# Patient Record
Sex: Female | Born: 1965
Health system: Southern US, Community
[De-identification: ages and names within clinical notes are randomized; demographics above are authoritative.]

## PROBLEM LIST (undated history)

## (undated) DIAGNOSIS — I10 Essential (primary) hypertension: Secondary | ICD-10-CM

## (undated) DIAGNOSIS — E119 Type 2 diabetes mellitus without complications: Secondary | ICD-10-CM

## (undated) HISTORY — DX: Type 2 diabetes mellitus without complications: E11.9

## (undated) HISTORY — PX: TUBAL LIGATION: SHX77

## (undated) HISTORY — DX: Essential (primary) hypertension: I10

---

## 2001-07-15 ENCOUNTER — Other Ambulatory Visit: Admission: RE | Admit: 2001-07-15 | Discharge: 2001-07-15 | Payer: Self-pay | Admitting: *Deleted

## 2001-08-21 ENCOUNTER — Encounter: Payer: Self-pay | Admitting: *Deleted

## 2001-08-21 ENCOUNTER — Ambulatory Visit (HOSPITAL_COMMUNITY): Admission: RE | Admit: 2001-08-21 | Discharge: 2001-08-21 | Payer: Self-pay | Admitting: *Deleted

## 2001-09-25 ENCOUNTER — Ambulatory Visit (HOSPITAL_COMMUNITY): Admission: RE | Admit: 2001-09-25 | Discharge: 2001-09-25 | Payer: Self-pay | Admitting: *Deleted

## 2001-09-25 ENCOUNTER — Encounter: Payer: Self-pay | Admitting: *Deleted

## 2001-11-17 ENCOUNTER — Ambulatory Visit (HOSPITAL_COMMUNITY): Admission: RE | Admit: 2001-11-17 | Discharge: 2001-11-17 | Payer: Self-pay | Admitting: *Deleted

## 2004-09-28 ENCOUNTER — Other Ambulatory Visit: Admission: RE | Admit: 2004-09-28 | Discharge: 2004-09-28 | Payer: Self-pay | Admitting: *Deleted

## 2005-11-19 LAB — CONVERTED CEMR LAB: Pap Smear: NORMAL

## 2006-05-27 ENCOUNTER — Ambulatory Visit: Payer: Self-pay | Admitting: Internal Medicine

## 2006-06-12 ENCOUNTER — Encounter (INDEPENDENT_AMBULATORY_CARE_PROVIDER_SITE_OTHER): Payer: Self-pay | Admitting: Internal Medicine

## 2006-07-26 ENCOUNTER — Ambulatory Visit: Payer: Self-pay | Admitting: Internal Medicine

## 2006-08-20 ENCOUNTER — Encounter (INDEPENDENT_AMBULATORY_CARE_PROVIDER_SITE_OTHER): Payer: Self-pay | Admitting: Internal Medicine

## 2006-08-23 ENCOUNTER — Ambulatory Visit: Payer: Self-pay | Admitting: Internal Medicine

## 2006-11-22 DIAGNOSIS — I10 Essential (primary) hypertension: Secondary | ICD-10-CM | POA: Insufficient documentation

## 2006-12-03 ENCOUNTER — Ambulatory Visit: Payer: Self-pay | Admitting: Family Medicine

## 2006-12-03 ENCOUNTER — Encounter (INDEPENDENT_AMBULATORY_CARE_PROVIDER_SITE_OTHER): Payer: Self-pay | Admitting: Internal Medicine

## 2006-12-04 ENCOUNTER — Encounter (INDEPENDENT_AMBULATORY_CARE_PROVIDER_SITE_OTHER): Payer: Self-pay | Admitting: Internal Medicine

## 2006-12-23 ENCOUNTER — Encounter (INDEPENDENT_AMBULATORY_CARE_PROVIDER_SITE_OTHER): Payer: Self-pay | Admitting: Internal Medicine

## 2006-12-26 ENCOUNTER — Telehealth (INDEPENDENT_AMBULATORY_CARE_PROVIDER_SITE_OTHER): Payer: Self-pay | Admitting: Internal Medicine

## 2007-03-13 ENCOUNTER — Encounter (INDEPENDENT_AMBULATORY_CARE_PROVIDER_SITE_OTHER): Payer: Self-pay | Admitting: Internal Medicine

## 2007-03-17 ENCOUNTER — Ambulatory Visit: Payer: Self-pay | Admitting: Internal Medicine

## 2007-03-17 DIAGNOSIS — B353 Tinea pedis: Secondary | ICD-10-CM | POA: Insufficient documentation

## 2007-03-17 DIAGNOSIS — E876 Hypokalemia: Secondary | ICD-10-CM | POA: Insufficient documentation

## 2007-03-17 LAB — CONVERTED CEMR LAB
Creatinine, Urine: 299.3 mg/dL
Hgb A1c MFr Bld: 7.8 %
Microalb Creat Ratio: 25.7 mg/g (ref 0.0–30.0)
Microalb, Ur: 7.69 mg/dL — ABNORMAL HIGH (ref 0.00–1.89)

## 2007-03-19 LAB — CONVERTED CEMR LAB: Potassium: 3 meq/L — ABNORMAL LOW (ref 3.5–5.3)

## 2007-03-20 ENCOUNTER — Encounter (INDEPENDENT_AMBULATORY_CARE_PROVIDER_SITE_OTHER): Payer: Self-pay | Admitting: Internal Medicine

## 2007-06-18 ENCOUNTER — Telehealth (INDEPENDENT_AMBULATORY_CARE_PROVIDER_SITE_OTHER): Payer: Self-pay | Admitting: *Deleted

## 2007-06-18 ENCOUNTER — Ambulatory Visit: Payer: Self-pay | Admitting: Internal Medicine

## 2007-06-18 LAB — CONVERTED CEMR LAB: Hgb A1c MFr Bld: 9.4 %

## 2007-06-19 LAB — CONVERTED CEMR LAB
BUN: 7 mg/dL (ref 6–23)
CO2: 24 meq/L (ref 19–32)
Calcium: 9.1 mg/dL (ref 8.4–10.5)
Chloride: 102 meq/L (ref 96–112)
Cholesterol: 149 mg/dL (ref 0–200)
Creatinine, Ser: 0.76 mg/dL (ref 0.40–1.20)
Glucose, Bld: 268 mg/dL — ABNORMAL HIGH (ref 70–99)
HDL: 43 mg/dL (ref >39)
Hgb A1c MFr Bld: 10.3 % — ABNORMAL HIGH (ref 4.6–6.1)
LDL Cholesterol: 93 mg/dL (ref 0–99)
Potassium: 3.5 meq/L (ref 3.5–5.3)
Sodium: 136 meq/L (ref 135–145)
Total CHOL/HDL Ratio: 3.5
Triglycerides: 63 mg/dL (ref <150)
VLDL: 13 mg/dL (ref 0–40)

## 2007-06-20 ENCOUNTER — Telehealth (INDEPENDENT_AMBULATORY_CARE_PROVIDER_SITE_OTHER): Payer: Self-pay | Admitting: *Deleted

## 2007-09-09 ENCOUNTER — Ambulatory Visit: Payer: Self-pay | Admitting: Internal Medicine

## 2007-12-23 ENCOUNTER — Telehealth (INDEPENDENT_AMBULATORY_CARE_PROVIDER_SITE_OTHER): Payer: Self-pay | Admitting: *Deleted

## 2007-12-23 ENCOUNTER — Ambulatory Visit: Payer: Self-pay | Admitting: Internal Medicine

## 2007-12-23 LAB — CONVERTED CEMR LAB: Hgb A1c MFr Bld: 7.3 %

## 2007-12-25 ENCOUNTER — Encounter (INDEPENDENT_AMBULATORY_CARE_PROVIDER_SITE_OTHER): Payer: Self-pay | Admitting: Internal Medicine

## 2007-12-26 ENCOUNTER — Telehealth (INDEPENDENT_AMBULATORY_CARE_PROVIDER_SITE_OTHER): Payer: Self-pay | Admitting: *Deleted

## 2007-12-26 LAB — CONVERTED CEMR LAB
CO2: 25 meq/L (ref 19–32)
Glucose, Bld: 149 mg/dL — ABNORMAL HIGH (ref 70–99)
Potassium: 3.3 meq/L — ABNORMAL LOW (ref 3.5–5.3)
Sodium: 139 meq/L (ref 135–145)

## 2007-12-31 ENCOUNTER — Encounter (INDEPENDENT_AMBULATORY_CARE_PROVIDER_SITE_OTHER): Payer: Self-pay | Admitting: Internal Medicine

## 2008-03-24 ENCOUNTER — Ambulatory Visit: Payer: Self-pay | Admitting: Internal Medicine

## 2008-03-24 DIAGNOSIS — R350 Frequency of micturition: Secondary | ICD-10-CM

## 2008-03-24 LAB — CONVERTED CEMR LAB: Blood Glucose, Fingerstick: 168

## 2008-08-04 ENCOUNTER — Ambulatory Visit: Payer: Self-pay | Admitting: Internal Medicine

## 2008-08-04 LAB — CONVERTED CEMR LAB
Blood Glucose, Fingerstick: 219
Hgb A1c MFr Bld: 8.7 %

## 2008-08-09 ENCOUNTER — Encounter (INDEPENDENT_AMBULATORY_CARE_PROVIDER_SITE_OTHER): Payer: Self-pay | Admitting: Internal Medicine

## 2008-08-11 ENCOUNTER — Ambulatory Visit: Payer: Self-pay | Admitting: Internal Medicine

## 2008-08-11 LAB — CONVERTED CEMR LAB
ALT: 14 units/L (ref 0–35)
AST: 14 units/L (ref 0–37)
CO2: 23 meq/L (ref 19–32)
Chloride: 98 meq/L (ref 96–112)
Cholesterol: 154 mg/dL (ref 0–200)
LDL Cholesterol: 100 mg/dL — ABNORMAL HIGH (ref 0–99)
Sodium: 138 meq/L (ref 135–145)
Total Bilirubin: 0.7 mg/dL (ref 0.3–1.2)
Total CHOL/HDL Ratio: 3.7
Total Protein: 7.8 g/dL (ref 6.0–8.3)
VLDL: 12 mg/dL (ref 0–40)

## 2008-09-06 ENCOUNTER — Ambulatory Visit: Payer: Self-pay | Admitting: Internal Medicine

## 2008-11-03 ENCOUNTER — Encounter (INDEPENDENT_AMBULATORY_CARE_PROVIDER_SITE_OTHER): Payer: Self-pay | Admitting: Internal Medicine

## 2008-11-26 ENCOUNTER — Ambulatory Visit: Payer: Self-pay | Admitting: Internal Medicine

## 2008-11-26 DIAGNOSIS — E119 Type 2 diabetes mellitus without complications: Secondary | ICD-10-CM

## 2008-11-26 LAB — CONVERTED CEMR LAB
Blood Glucose, Fingerstick: 177
Hgb A1c MFr Bld: 6.8 %

## 2009-02-25 ENCOUNTER — Encounter (INDEPENDENT_AMBULATORY_CARE_PROVIDER_SITE_OTHER): Payer: Self-pay | Admitting: Internal Medicine

## 2009-03-02 ENCOUNTER — Ambulatory Visit: Payer: Self-pay | Admitting: Internal Medicine

## 2009-03-02 LAB — CONVERTED CEMR LAB: Blood Glucose, Fingerstick: 201

## 2009-03-03 ENCOUNTER — Encounter (INDEPENDENT_AMBULATORY_CARE_PROVIDER_SITE_OTHER): Payer: Self-pay | Admitting: Internal Medicine

## 2009-03-03 LAB — CONVERTED CEMR LAB: Microalb, Ur: 6.28 mg/dL — ABNORMAL HIGH (ref 0.00–1.89)

## 2009-03-07 ENCOUNTER — Encounter (INDEPENDENT_AMBULATORY_CARE_PROVIDER_SITE_OTHER): Payer: Self-pay | Admitting: Internal Medicine

## 2009-03-07 LAB — CONVERTED CEMR LAB
AST: 19 units/L (ref 0–37)
Alkaline Phosphatase: 70 units/L (ref 39–117)
BUN: 11 mg/dL (ref 6–23)
Creatinine, Ser: 0.75 mg/dL (ref 0.40–1.20)
Glucose, Bld: 112 mg/dL — ABNORMAL HIGH (ref 70–99)
HDL: 41 mg/dL (ref >39)
LDL Cholesterol: 85 mg/dL (ref 0–99)
Potassium: 3.1 meq/L — ABNORMAL LOW (ref 3.5–5.3)
Total Bilirubin: 0.5 mg/dL (ref 0.3–1.2)
Total CHOL/HDL Ratio: 3.4
Triglycerides: 73 mg/dL (ref <150)
VLDL: 15 mg/dL (ref 0–40)

## 2009-03-11 ENCOUNTER — Encounter (INDEPENDENT_AMBULATORY_CARE_PROVIDER_SITE_OTHER): Payer: Self-pay | Admitting: Internal Medicine

## 2009-04-11 ENCOUNTER — Telehealth (INDEPENDENT_AMBULATORY_CARE_PROVIDER_SITE_OTHER): Payer: Self-pay | Admitting: *Deleted

## 2009-07-06 ENCOUNTER — Ambulatory Visit: Payer: Self-pay | Admitting: Internal Medicine

## 2009-07-06 LAB — CONVERTED CEMR LAB: Hgb A1c MFr Bld: 6.8 %

## 2010-12-17 LAB — CONVERTED CEMR LAB
BUN: 9 mg/dL
CO2: 26 meq/L
Calcium: 8.9 mg/dL
Chloride: 102 meq/L
Cholesterol: 145 mg/dL
Creatinine, Ser: 0.77 mg/dL
Glucose, Bld: 125 mg/dL
Glucose, Bld: 186 mg/dL — ABNORMAL HIGH (ref 70–99)
HDL: 41 mg/dL
LDL Cholesterol: 92 mg/dL
Potassium: 3.1 meq/L — ABNORMAL LOW (ref 3.5–5.3)
Potassium: 3.2 meq/L
Sodium: 139 meq/L
Total CHOL/HDL Ratio: 3.5
Triglycerides: 58 mg/dL
VLDL: 12 mg/dL

## 2011-04-06 NOTE — H&P (Signed)
[facility]  Patient:    Jillian Adkins, Jillian Adkins Visit Number: [id number] MRN: [id number]          Service Type: OBS Location: 4A A427 01 Attending Physician:  Jeri Cos. Dictated by:   Langley Gauss, M.D. Admit Date:  11/17/2001   CC:         [facility]  Christin Bach, M.D.   History and Physical  OB OBSERVATION NOTE  HISTORY OF PRESENT ILLNESS:  This is a 45 year old gravida 3, para 2, with a history of chronic essential hypertension preceding the pregnancy.  The patient is likewise noted to have experienced problems with hypertension and superimposed preeclampsia with her first pregnancy.  With her second pregnancy she did well on antihypertensive medications.  Currently, her dosages are labetalol 400 mg p.o. b.i.d. and hydralazine 25 mg p.o. t.i.d.  Pertinently, the prescription for hydralazine was written on October 14, 2001, but the patient has never had it filled.  The patient comes in today for a routine prenatal visit; however, she states that she vomited today about 45 minutes after taking her dose of labetalol.  Her last dose of 400 mg of labetalol would have been about 6 oclock last night.  The patient at this time denies any headaches, blurred vision, right upper quadrant pain.  She denies any significant swelling.  She denies any uterine activity but states fetal activity has been good.  The patient states she has been somewhat stressed this morning and very hurried in trying to make her scheduled office visit. The patients prenatal course was also complicated by an abnormal one-hour GTT.  Glucose tolerance test was elevated at 157; however, a definitive three-hour GGT was noted to be normal.  The patient was also noted to have an abnormal AFP, placing her at 1/111 risk of Downs syndrome.  OBSTETRICAL HISTORY:  May 2000, repeat low transverse cesarean section at [redacted] weeks gestation, delivered 7-pound, 7.2-ounce  female infant.  Pertinently, discharge medications at that time following delivery:  Labetalol 200 mg p.o. b.i.d., hydrochlorothiazide 25 mg p.o. q.d., and Apresoline 25 mg p.o. t.i.d. OB prior to that:  Primary low transverse cesarean section December 1998, at 37-1/[redacted] weeks gestation.  Patient noted to experience severe pregnancy-induced hypertension at that time.  ALLERGIES:  No known drug allergies.  CURRENT MEDICATIONS:  The patient has been noncompliant, thus, has only taken labetalol 400 mg p.o. b.i.d.  PHYSICAL EXAMINATION:  VITAL SIGNS:  Height is 5 feet 4 inches, prenatal weight 262, todays weight is 276.  Pertinently, weight October 28, 2001, was 281.  Pulse rate 80, respiratory rate 20.  GENERAL:  Some facial swelling.  NECK:  Thyroid nonpalpable.  Supple.  LUNGS:  Clear.  CARDIOVASCULAR:  Regular rate and rhythm.  No gallops or murmurs are identified.  No evidence of cardiac failure.  ABDOMEN:  Soft and nontender.  Fundal height of 32 cm.  She is vertex presentation by Thayer Ohm maneuver.  Currently, there is no right upper quadrant pain appreciated.  EXTREMITIES:  Noted to reveal 1+ pretibial edema.  Deep tendon reflexes noted to be 2+ at the knees, with no clonus identified.  LABORATORY DATA:  In the office, a urine dipstick positive for 250 of protein, glucose noted to be negative.  The patient does have trace rbcs.  On the patients last visit she failed to produce a urine sample.  Last tested urine October 08, 2001, trace protein and negative glucose.  ASSESSMENT:  Patient with chronic  essential hypertension preceding the pregnancy.  More difficult to control this pregnancy than with previous, which may be partly related to the patients failure to optimize weight in between the pregnancies.  The patient has also been somewhat noncompliant in obtaining her prescriptions and taking them as recommended.  This, at this point in time, we will refer the patient to  [facility].  PLAN:  Will administer the labetalol 400 mg p.o. b.i.d. as well as the hydralazine 25 mg p.o. t.i.d., which the patient should be on anyway.  A non-stress test will be obtained.  Laboratory studies, likewise, will be obtained.  Will evaluate for the presence of any proteinuria.  Depending on the patients clinical status, she may require hospitalization overnight or may possibly be clinically stable for discharge today if medications are taken as prescribed. Dictated by:   Langley Gauss, M.D. Attending Physician:  Jeri Cos DD:  11/17/01 TD:  11/17/01 Job: [id number]

## 2011-07-25 ENCOUNTER — Other Ambulatory Visit (HOSPITAL_COMMUNITY): Payer: Self-pay | Admitting: Family Medicine

## 2011-07-25 DIAGNOSIS — I10 Essential (primary) hypertension: Secondary | ICD-10-CM

## 2011-07-25 DIAGNOSIS — Z01419 Encounter for gynecological examination (general) (routine) without abnormal findings: Secondary | ICD-10-CM

## 2011-07-25 DIAGNOSIS — E119 Type 2 diabetes mellitus without complications: Secondary | ICD-10-CM

## 2011-07-27 ENCOUNTER — Ambulatory Visit (HOSPITAL_COMMUNITY): Payer: Self-pay

## 2011-07-30 ENCOUNTER — Ambulatory Visit (HOSPITAL_COMMUNITY): Payer: Self-pay

## 2011-08-03 ENCOUNTER — Ambulatory Visit (HOSPITAL_COMMUNITY)
Admission: RE | Admit: 2011-08-03 | Discharge: 2011-08-03 | Disposition: A | Discharge status: Home / Self Care | Payer: 59 | Source: Ambulatory Visit | Attending: Family Medicine | Admitting: Family Medicine

## 2011-08-03 DIAGNOSIS — Z01419 Encounter for gynecological examination (general) (routine) without abnormal findings: Secondary | ICD-10-CM

## 2011-08-03 DIAGNOSIS — E119 Type 2 diabetes mellitus without complications: Secondary | ICD-10-CM

## 2011-08-03 DIAGNOSIS — Z1231 Encounter for screening mammogram for malignant neoplasm of breast: Secondary | ICD-10-CM | POA: Insufficient documentation

## 2011-08-03 DIAGNOSIS — I10 Essential (primary) hypertension: Secondary | ICD-10-CM

## 2012-04-30 ENCOUNTER — Other Ambulatory Visit (HOSPITAL_COMMUNITY)
Admission: RE | Admit: 2012-04-30 | Discharge: 2012-04-30 | Disposition: A | Discharge status: Home / Self Care | Payer: 59 | Source: Ambulatory Visit | Attending: Obstetrics and Gynecology | Admitting: Obstetrics and Gynecology

## 2012-04-30 ENCOUNTER — Other Ambulatory Visit: Payer: Self-pay | Admitting: Obstetrics and Gynecology

## 2012-04-30 DIAGNOSIS — Z01419 Encounter for gynecological examination (general) (routine) without abnormal findings: Secondary | ICD-10-CM | POA: Insufficient documentation

## 2012-09-11 ENCOUNTER — Other Ambulatory Visit: Payer: Self-pay | Admitting: Obstetrics and Gynecology

## 2012-09-11 DIAGNOSIS — Z139 Encounter for screening, unspecified: Secondary | ICD-10-CM

## 2012-09-22 ENCOUNTER — Ambulatory Visit (HOSPITAL_COMMUNITY)
Admission: RE | Admit: 2012-09-22 | Discharge: 2012-09-22 | Disposition: A | Discharge status: Home / Self Care | Payer: 59 | Source: Ambulatory Visit | Attending: Obstetrics and Gynecology | Admitting: Obstetrics and Gynecology

## 2012-09-22 DIAGNOSIS — Z139 Encounter for screening, unspecified: Secondary | ICD-10-CM

## 2012-09-22 DIAGNOSIS — Z1231 Encounter for screening mammogram for malignant neoplasm of breast: Secondary | ICD-10-CM | POA: Insufficient documentation

## 2013-09-24 ENCOUNTER — Other Ambulatory Visit (HOSPITAL_COMMUNITY): Payer: Self-pay | Admitting: Family Medicine

## 2013-09-24 DIAGNOSIS — Z139 Encounter for screening, unspecified: Secondary | ICD-10-CM

## 2013-10-05 ENCOUNTER — Ambulatory Visit (HOSPITAL_COMMUNITY)
Admission: RE | Admit: 2013-10-05 | Discharge: 2013-10-05 | Disposition: A | Discharge status: Home / Self Care | Payer: 59 | Source: Ambulatory Visit | Attending: Family Medicine | Admitting: Family Medicine

## 2013-10-05 DIAGNOSIS — Z139 Encounter for screening, unspecified: Secondary | ICD-10-CM

## 2013-10-05 DIAGNOSIS — Z1231 Encounter for screening mammogram for malignant neoplasm of breast: Secondary | ICD-10-CM | POA: Insufficient documentation

## 2014-09-27 ENCOUNTER — Other Ambulatory Visit (HOSPITAL_COMMUNITY): Payer: Self-pay | Admitting: Physician Assistant

## 2014-09-27 DIAGNOSIS — Z1231 Encounter for screening mammogram for malignant neoplasm of breast: Secondary | ICD-10-CM

## 2014-10-18 ENCOUNTER — Ambulatory Visit (HOSPITAL_COMMUNITY)
Admission: RE | Admit: 2014-10-18 | Discharge: 2014-10-18 | Disposition: A | Discharge status: Home / Self Care | Payer: 59 | Source: Ambulatory Visit | Attending: Physician Assistant | Admitting: Physician Assistant

## 2014-10-18 DIAGNOSIS — Z1231 Encounter for screening mammogram for malignant neoplasm of breast: Secondary | ICD-10-CM

## 2015-01-27 ENCOUNTER — Ambulatory Visit (INDEPENDENT_AMBULATORY_CARE_PROVIDER_SITE_OTHER): Payer: 59 | Admitting: Obstetrics and Gynecology

## 2015-01-27 ENCOUNTER — Encounter: Payer: Self-pay | Admitting: Obstetrics and Gynecology

## 2015-01-27 VITALS — BP 140/90 | Ht 64.0 in | Wt 253.0 lb

## 2015-01-27 DIAGNOSIS — N926 Irregular menstruation, unspecified: Secondary | ICD-10-CM

## 2015-01-27 NOTE — Progress Notes (Signed)
Patient ID: Roselle Locus, female   DOB: 06/08/1966, 49 y.o.   MRN: [id number] This chart was SCRIBED for Mallory Shirk, MD by Stephania Fragmin, ED Scribe. This patient was seen in room 1 and the patient's care was started at 4:07 PM.    [facility] Visit  Patient name: VEEDA VIRGO MRN [id number]  Date of birth: 1966-09-27  CC & HPI:  SERAH NICOLETTI is a 49 y.o. female presenting today for irregular menses. She states that during her period, she has to wear a heavy pad every day. She also reports clots and abdominal cramps. Patient states she was seen for the same by me in 2014. Patient's last pap was done in 04/30/2012, per chart review.  ROS:  +Menstrual problem No other complaints  Pertinent History Reviewed:   Reviewed: Significant for Cesarean section and tubal ligation Medical         Past Medical History  Diagnosis Date  . Hypertension   . Diabetes mellitus without complication                               Surgical Hx:    Past Surgical History  Procedure Laterality Date  . Cesarean section  [phone]  . Tubal ligation     Medications: Reviewed & Updated - see associated section                       Current outpatient prescriptions:  .  amLODipine (NORVASC) 10 MG tablet, Take 10 mg by mouth daily., Disp: , Rfl: 1 .  JENTADUETO 2.03-999 MG TABS, Take 1 tablet by mouth 2 (two) times daily., Disp: , Rfl: 2 .  lisinopril (PRINIVIL,ZESTRIL) 5 MG tablet, Take 5 mg by mouth daily., Disp: , Rfl: 1 .  spironolactone (ALDACTONE) 25 MG tablet, Take 25 mg by mouth daily., Disp: , Rfl: 3   Social History: Reviewed -  reports that she has never smoked. She has never used smokeless tobacco.  Objective Findings:  Vitals: Blood pressure 140/90, height 5\' 4"  (1.626 m), weight 253 lb (114.76 kg), last menstrual period 01/01/2015.  Physical Examination: General appearance - alert, well appearing, and in no distress, oriented to person, place, and time and overweight Mental  status - alert, oriented to person, place, and time, normal mood, behavior, speech, dress, motor activity, and thought processes, affect appropriate to mood Pelvic - normal external genitalia, vulva, vagina, cervix, uterus and adnexa,  VULVA: normal appearing vulva with no masses, tenderness or lesions,  VAGINA: normal appearing vagina with normal color and discharge, no lesions,  CERVIX: normal appearing cervix without discharge or lesions,  UTERUS:soft tender to touch . Size assessment limited by pt anxiety and habitus.  ADNEXA: normal adnexa in size, nontender and no masses,  RECTAL: normal rectal, no masses   Assessment & Plan:   A:  1. AUB. 2 obesity limits exam  P:  1. Will schedule [place] of Uterus and discuss 2. Last pap 2013, needs pap/physical  I personally performed the services described in this documentation, which was SCRIBED in my presence. The recorded information has been reviewed and considered accurate. It has been edited as necessary during review. Jonnie Kind, MD

## 2015-01-27 NOTE — Progress Notes (Signed)
Patient ID: Jillian Adkins, female   DOB: 11-19-1966, 49 y.o.   MRN: [id number] Pt here today for irregular and prolonged periods. Pt states that she has problems with her periods for the past few months. Pt states that sometimes she has a heaviness in her lower stomach.

## 2015-02-10 ENCOUNTER — Encounter: Payer: Self-pay | Admitting: Obstetrics & Gynecology

## 2015-02-10 ENCOUNTER — Ambulatory Visit (INDEPENDENT_AMBULATORY_CARE_PROVIDER_SITE_OTHER): Payer: 59

## 2015-02-10 ENCOUNTER — Ambulatory Visit (INDEPENDENT_AMBULATORY_CARE_PROVIDER_SITE_OTHER): Payer: 59 | Admitting: Obstetrics & Gynecology

## 2015-02-10 VITALS — BP 146/90 | HR 92 | Ht 64.0 in | Wt 254.0 lb

## 2015-02-10 DIAGNOSIS — N921 Excessive and frequent menstruation with irregular cycle: Secondary | ICD-10-CM | POA: Diagnosis not present

## 2015-02-10 DIAGNOSIS — N926 Irregular menstruation, unspecified: Secondary | ICD-10-CM

## 2015-02-10 MED ORDER — MEGESTROL ACETATE 40 MG PO TABS: ORAL_TABLET | ORAL | Status: DC

## 2015-02-10 NOTE — Progress Notes (Signed)
[place] anteverted uterus,eec .4cm, simple lt ov cyst 2.7x 3.09x 2.53cm,rt ov wnl,no free fluid

## 2015-02-10 NOTE — Progress Notes (Signed)
Patient ID: Jillian Adkins, female   DOB: 1966-06-08, 49 y.o.   MRN: [id number] Chief Complaint  Patient presents with  . gyn visit    ultrasound today.   Blood pressure 146/90, pulse 92, height 5\' 4"  (1.626 m), weight 254 lb (115.214 kg), last menstrual period 01/01/2015.  Patient is seen in today for an ultrasound due to heavy irregular periods with cramping and to discuss the results of that  [name] is a 49 y.o. for a pelvic sonogram for irreg periods.  Uterus 8.1 x 6.0 x 4.5 cm,   Endometrium 4 mm, symmetrical,   Right ovary 2.1 x 1.9 cm  Left ovary 3.4 x 3.2 x 2.9 cm, With simple cyst - 3.1 x 2.7 x 2.5cm  Technician Comments:  NL Uterus, ENdom-63mm, Lt ovarian cyst- 3.1 x 2.7 x 2.5cm   Silver Huguenin 02/10/2015 12:57 PM   Clinical Impression and recommendations:  I have reviewed the sonogram results above, combined with the patient's current clinical course, below are my impressions and any appropriate recommendations for management based on the sonographic findings.  Physiologic cyst left ovary otherwise normal gyn anatomy, specifically no uterine or endometrial pathology No anatomical etiology for the patient's periods is seen   [name]  As you can see above the sonogram reveals a physiological cyst of the left ovary relatively small otherwise a completely normal exam  Menometrorrhagia Dysmenorrhea  We'll try a course of Megace to see if we can control her cycles Patient understands this may be a short versus an intermediate length option Does not work as well over the long-term but certainly in the short-term we'll give [place] an opportunity see how she responds She understands endometrial ablation may be an option going forward     Face to face time:  15 minutes  Greater than 50% of the visit time was spent in counseling and coordination of care with the patient.   The summary and outline of the counseling and care coordination is summarized in the note above.   All questions were answered.

## 2015-03-03 ENCOUNTER — Encounter: Payer: Self-pay | Admitting: Obstetrics & Gynecology

## 2015-03-03 ENCOUNTER — Ambulatory Visit (INDEPENDENT_AMBULATORY_CARE_PROVIDER_SITE_OTHER): Payer: 59 | Admitting: Obstetrics & Gynecology

## 2015-03-03 VITALS — BP 168/100 | HR 88 | Ht 64.0 in | Wt 252.0 lb

## 2015-03-03 DIAGNOSIS — N921 Excessive and frequent menstruation with irregular cycle: Secondary | ICD-10-CM

## 2015-03-03 MED ORDER — MEGESTROL ACETATE 40 MG PO TABS: ORAL_TABLET | ORAL | Status: DC

## 2015-03-03 NOTE — Progress Notes (Signed)
Patient ID: Jillian Adkins, female   DOB: 08/09/1966, 49 y.o.   MRN: [id number] Pt is doing quite well on the megestrol no bleeding since being on it Sonogram was normal  Pt informed she can be on megestrol in the short term long term is less predictable  Introduced endometrial ablation to her and she will consider it for a longer term solution  contineu megestrol     Face to face time:  10 minutes  Greater than 50% of the visit time was spent in counseling and coordination of care with the patient.  The summary and outline of the counseling and care coordination is summarized in the note above.   All questions were answered.

## 2015-10-06 ENCOUNTER — Other Ambulatory Visit (HOSPITAL_COMMUNITY): Payer: Self-pay | Admitting: Family Medicine

## 2015-10-06 DIAGNOSIS — Z1231 Encounter for screening mammogram for malignant neoplasm of breast: Secondary | ICD-10-CM

## 2015-10-24 ENCOUNTER — Ambulatory Visit (HOSPITAL_COMMUNITY)
Admission: RE | Admit: 2015-10-24 | Discharge: 2015-10-24 | Disposition: A | Discharge status: Home / Self Care | Payer: 59 | Source: Ambulatory Visit | Attending: Family Medicine | Admitting: Family Medicine

## 2015-10-24 DIAGNOSIS — Z1231 Encounter for screening mammogram for malignant neoplasm of breast: Secondary | ICD-10-CM | POA: Insufficient documentation

## 2016-10-23 LAB — HEMOGLOBIN A1C: Hemoglobin A1C: 14

## 2016-12-10 ENCOUNTER — Ambulatory Visit: Payer: 59 | Admitting: "Endocrinology

## 2016-12-19 ENCOUNTER — Encounter: Payer: Self-pay | Admitting: "Endocrinology

## 2016-12-19 ENCOUNTER — Ambulatory Visit (INDEPENDENT_AMBULATORY_CARE_PROVIDER_SITE_OTHER): Payer: 59 | Admitting: "Endocrinology

## 2016-12-19 VITALS — BP 132/89 | HR 82 | Ht 64.0 in | Wt 248.0 lb

## 2016-12-19 DIAGNOSIS — E118 Type 2 diabetes mellitus with unspecified complications: Secondary | ICD-10-CM | POA: Diagnosis not present

## 2016-12-19 DIAGNOSIS — E1165 Type 2 diabetes mellitus with hyperglycemia: Secondary | ICD-10-CM | POA: Diagnosis not present

## 2016-12-19 DIAGNOSIS — I10 Essential (primary) hypertension: Secondary | ICD-10-CM

## 2016-12-19 DIAGNOSIS — IMO0002 Reserved for concepts with insufficient information to code with codable children: Secondary | ICD-10-CM | POA: Insufficient documentation

## 2016-12-19 MED ORDER — EMPAGLIFLOZIN 10 MG PO TABS: 10.0000 mg | ORAL_TABLET | Freq: Every day | ORAL | 2 refills | Status: DC

## 2016-12-19 NOTE — Progress Notes (Signed)
Subjective:    Patient ID: Jillian Adkins, female    DOB: January 08, 1966. Patient is being seen in consultation for management of diabetes requested by  Emily Filbert, PA-C (Inactive)  Past Medical History:  Diagnosis Date  . Diabetes mellitus without complication ([place])   . Hypertension    Past Surgical History:  Procedure Laterality Date  . CESAREAN SECTION  [phone]  . TUBAL LIGATION     Social History   Social History  . Marital status: Married    Spouse name: N/A  . Number of children: N/A  . Years of education: N/A   Social History Main Topics  . Smoking status: Never Smoker  . Smokeless tobacco: Never Used  . Alcohol use No  . Drug use: No  . Sexual activity: Yes    Birth control/ protection: Surgical     Comment: tubal   Other Topics Concern  . None   Social History Narrative  . None   Outpatient Encounter Prescriptions as of 12/19/2016  Medication Sig  . metFORMIN (GLUCOPHAGE) 1000 MG tablet Take 1,000 mg by mouth 2 (two) times daily with a meal.  . olmesartan-hydrochlorothiazide (BENICAR HCT) 40-25 MG tablet Take 1 tablet by mouth daily.  Marland Kitchen spironolactone (ALDACTONE) 50 MG tablet Take 50 mg by mouth daily.  . [DISCONTINUED] glipiZIDE (GLUCOTROL XL) 10 MG 24 hr tablet Take 10 mg by mouth 2 (two) times daily.  Marland Kitchen amLODipine (NORVASC) 10 MG tablet Take 10 mg by mouth daily.  . empagliflozin (JARDIANCE) 10 MG TABS tablet Take 10 mg by mouth daily.  . [DISCONTINUED] JENTADUETO 2.03-999 MG TABS Take 1 tablet by mouth 2 (two) times daily.  . [DISCONTINUED] lisinopril (PRINIVIL,ZESTRIL) 5 MG tablet Take 5 mg by mouth daily.  . [DISCONTINUED] megestrol (MEGACE) 40 MG tablet 3 tablets a day for 5 days, 2 tablets a day for 5 days then 1 tablet daily  . [DISCONTINUED] spironolactone (ALDACTONE) 25 MG tablet Take 25 mg by mouth daily.   No facility-administered encounter medications on file as of 12/19/2016.    ALLERGIES: No Known Allergies VACCINATION  STATUS: Immunization History  Administered Date(s) Administered  . Influenza Whole 09/09/2007, 08/11/2008    Diabetes  She presents for her initial diabetic visit. She has type 2 diabetes mellitus. Onset time: She was diagnosed at approximate age of 89 years. Her disease course has been worsening. There are no hypoglycemic associated symptoms. Pertinent negatives for hypoglycemia include no confusion, headaches, pallor or seizures. Associated symptoms include blurred vision, fatigue, polydipsia and polyuria. Pertinent negatives for diabetes include no chest pain and no polyphagia. There are no hypoglycemic complications. Symptoms are worsening. There are no diabetic complications. Risk factors for coronary artery disease include diabetes mellitus, hypertension, obesity, sedentary lifestyle and family history. Current diabetic treatments: She is on glipizide 10 mg 2 times daily and metformin 1000 mg by mouth twice a day. Her weight is decreasing steadily. She is following a generally unhealthy diet. When asked about meal planning, she reported none. She has not had a previous visit with a dietitian. She rarely participates in exercise. (She did not bring any meter nor logs to review today. She admits that she does not monitor blood glucose regularly.) An ACE inhibitor/angiotensin II receptor blocker is being taken. Eye exam is current.  Hypertension  This is a chronic problem. The current episode started more than 1 year ago. The problem is controlled. Associated symptoms include blurred vision. Pertinent negatives include no chest pain, headaches, palpitations or  shortness of breath. Risk factors for coronary artery disease include dyslipidemia, diabetes mellitus, obesity and sedentary lifestyle. Past treatments include angiotensin blockers.       Review of Systems  Constitutional: Positive for fatigue. Negative for chills, fever and unexpected weight change.  HENT: Negative for trouble swallowing  and voice change.   Eyes: Positive for blurred vision. Negative for visual disturbance.  Respiratory: Negative for cough, shortness of breath and wheezing.   Cardiovascular: Negative for chest pain, palpitations and leg swelling.  Gastrointestinal: Negative for diarrhea, nausea and vomiting.  Endocrine: Positive for polydipsia and polyuria. Negative for cold intolerance, heat intolerance and polyphagia.  Musculoskeletal: Negative for arthralgias and myalgias.  Skin: Negative for color change, pallor, rash and wound.  Neurological: Negative for seizures and headaches.  Psychiatric/Behavioral: Negative for confusion and suicidal ideas.    Objective:    BP 132/89   Pulse 82   Ht 5\' 4"  (1.626 m)   Wt 248 lb (112.5 kg)   BMI 42.57 kg/m   Wt Readings from Last 3 Encounters:  12/19/16 248 lb (112.5 kg)  03/03/15 252 lb (114.3 kg)  02/10/15 254 lb (115.2 kg)    Physical Exam  Constitutional: She is oriented to person, place, and time. She appears well-developed.  HENT:  Head: Normocephalic and atraumatic.  Eyes: EOM are normal.  Neck: Normal range of motion. Neck supple. No tracheal deviation present. No thyromegaly present.  Cardiovascular: Normal rate and regular rhythm.   Pulmonary/Chest: Effort normal and breath sounds normal.  Abdominal: Soft. Bowel sounds are normal. There is no tenderness. There is no guarding.  Musculoskeletal: Normal range of motion. She exhibits no edema.  Neurological: She is alert and oriented to person, place, and time. She has normal reflexes. No cranial nerve deficit. Coordination normal.  Skin: Skin is warm and dry. No rash noted. No erythema. No pallor.  Psychiatric: She has a normal mood and affect. Judgment normal.    CMP     Component Value Date/Time   NA 139 03/03/2009 0106   K 3.1 (L) 03/03/2009 0106   CL 103 03/03/2009 0106   CO2 24 03/03/2009 0106   GLUCOSE 112 (H) 03/03/2009 0106   BUN 11 03/03/2009 0106   CREATININE 0.75 03/03/2009  0106   CALCIUM 9.1 03/03/2009 0106   PROT 7.3 03/03/2009 0106   ALBUMIN 3.7 03/03/2009 0106   AST 19 03/03/2009 0106   ALT 23 03/03/2009 0106   ALKPHOS 70 03/03/2009 0106   BILITOT 0.5 03/03/2009 0106     Diabetic Labs (most recent): Lab Results  Component Value Date   HGBA1C 14 10/23/2016   HGBA1C 6.8 07/06/2009   HGBA1C 7.2 03/02/2009     Lipid Panel ( most recent) Lipid Panel     Component Value Date/Time   CHOL 141 03/03/2009 0106   TRIG 73 03/03/2009 0106   HDL 41 03/03/2009 0106   CHOLHDL 3.4 Ratio 03/03/2009 0106   VLDL 15 03/03/2009 0106   LDLCALC 85 03/03/2009 0106     Assessment & Plan:   1. Uncontrolled type 2 diabetes mellitus with complication, without long-term current use of insulin ([place])  - Patient has currently uncontrolled symptomatic type 2 DM since  51 years of age,  with most recent A1c of greater than 14 %. Recent labs reviewed.   She does not report gross complications, however, patient remains at a high risk for more acute and chronic complications of diabetes which include CAD, CVA, CKD, retinopathy, and neuropathy. These are  all discussed in detail with the patient.  - I have counseled the patient on diet management and weight loss, by adopting a carbohydrate restricted/protein rich diet.  - Suggestion is made for patient to avoid simple carbohydrates   from their diet including Cakes , Desserts, Ice Cream,  Soda (  diet and regular) , Sweet Tea , Candies,  Chips, Cookies, Artificial Sweeteners,   and "Sugar-free" Products . This will help patient to have stable blood glucose profile and potentially avoid unintended weight gain.  - I encouraged the patient to switch to  unprocessed or minimally processed complex starch and increased protein intake (animal or plant source), fruits, and vegetables.  - Patient is advised to stick to a routine mealtimes to eat 3 meals  a day and avoid unnecessary snacks ( to snack only to correct hypoglycemia).  -  The patient will be scheduled with Jearld Fenton, RDN, CDE for individualized DM education.  - I have approached patient with the following individualized plan to manage diabetes and patient agrees:   - Based on her current vitamin burden she may need insulin treatment however it is essential to assure her commitment for monitoring. -  I  will proceed to initiate strict monitoring of glucose 4 times a day-before meals and at bedtime and return in one week with her meter and logs to review.   -Patient is encouraged to call clinic for blood glucose levels less than 70 or above 300 mg /dl. - I will continue  metformin 1000 mg by mouth twice a day, therapeutically suitable for patient. - I will add Jardiance 10 mg by mouth every morning. Side effects and precautions discussed with her. - I will discontinue  glipizide, risk outweighs benefit for this patient.  - Patient will be considered for incretin therapy as appropriate next visit. - Patient specific target  A1c;  LDL, HDL, Triglycerides, and  Waist Circumference were discussed in detail.  2) BP/HTN:  Controlled. Continue current medications including ARB. 3) Lipids/HPL:   Current control unknown, she is not on statin medications. I'll proceed to obtain fasting lipid panel on subsequent visits. 4)  Weight/Diet: CDE Consult will be initiated , exercise, and detailed carbohydrates information provided.  5) Chronic Care/Health Maintenance:  -Patient is on ARB and  encouraged to continue to follow up with Ophthalmology, Podiatrist at least yearly or according to recommendations, and advised to   stay away from smoking. I have recommended yearly flu vaccine and pneumonia vaccination at least every 5 years; moderate intensity exercise for up to 150 minutes weekly; and  sleep for at least 7 hours a day.  - 60 minutes of time was spent on the care of this patient , 50% of which was applied for counseling on diabetes complications and their  preventions.  - Patient to bring meter and  blood glucose logs during her next visit.  - I advised patient to maintain close follow up with Emily Filbert, PA-C (Inactive) for primary care needs.  Follow up plan: - Return in about 1 week (around 12/26/2016) for follow up with meter and logs- no labs.  Glade Lloyd, MD Phone: [phone]  Fax: [phone]   12/19/2016, 2:26 PM

## 2016-12-19 NOTE — Patient Instructions (Signed)

## 2017-01-02 ENCOUNTER — Encounter: Payer: Self-pay | Admitting: "Endocrinology

## 2017-01-02 ENCOUNTER — Ambulatory Visit (INDEPENDENT_AMBULATORY_CARE_PROVIDER_SITE_OTHER): Payer: 59 | Admitting: "Endocrinology

## 2017-01-02 VITALS — BP 140/82 | HR 97 | Resp 18 | Ht 64.0 in | Wt 247.0 lb

## 2017-01-02 DIAGNOSIS — E1165 Type 2 diabetes mellitus with hyperglycemia: Secondary | ICD-10-CM | POA: Diagnosis not present

## 2017-01-02 DIAGNOSIS — I10 Essential (primary) hypertension: Secondary | ICD-10-CM | POA: Diagnosis not present

## 2017-01-02 DIAGNOSIS — E118 Type 2 diabetes mellitus with unspecified complications: Secondary | ICD-10-CM | POA: Diagnosis not present

## 2017-01-02 DIAGNOSIS — IMO0002 Reserved for concepts with insufficient information to code with codable children: Secondary | ICD-10-CM

## 2017-01-02 MED ORDER — INSULIN PEN NEEDLE 31G X 8 MM MISC: 1.0000 | 3 refills | Status: DC

## 2017-01-02 MED ORDER — INSULIN GLARGINE 300 UNIT/ML ~~LOC~~ SOPN: 20.0000 [IU] | PEN_INJECTOR | Freq: Every day | SUBCUTANEOUS | 2 refills | Status: DC

## 2017-01-02 NOTE — Progress Notes (Signed)
Subjective:    Patient ID: Jillian Adkins, female    DOB: January 13, 1966. Patient is being seen in consultation for management of diabetes requested by  Emily Filbert, PA-C (Inactive)  Past Medical History:  Diagnosis Date  . Diabetes mellitus without complication ([place])   . Hypertension    Past Surgical History:  Procedure Laterality Date  . CESAREAN SECTION  [phone]  . TUBAL LIGATION     Social History   Social History  . Marital status: Married    Spouse name: N/A  . Number of children: N/A  . Years of education: N/A   Social History Main Topics  . Smoking status: Never Smoker  . Smokeless tobacco: Never Used  . Alcohol use No  . Drug use: No  . Sexual activity: Yes    Birth control/ protection: Surgical     Comment: tubal   Other Topics Concern  . None   Social History Narrative  . None   Outpatient Encounter Prescriptions as of 01/02/2017  Medication Sig  . amLODipine (NORVASC) 10 MG tablet Take 10 mg by mouth daily.  . empagliflozin (JARDIANCE) 10 MG TABS tablet Take 10 mg by mouth daily.  . metFORMIN (GLUCOPHAGE) 1000 MG tablet Take 1,000 mg by mouth 2 (two) times daily with a meal.  . olmesartan-hydrochlorothiazide (BENICAR HCT) 40-25 MG tablet Take 1 tablet by mouth daily.  Marland Kitchen spironolactone (ALDACTONE) 50 MG tablet Take 50 mg by mouth daily.  . Insulin Glargine (TOUJEO SOLOSTAR) 300 UNIT/ML SOPN Inject 20 Units into the skin at bedtime.  . Insulin Pen Needle (B-D ULTRAFINE III SHORT PEN) 31G X 8 MM MISC 1 each by Does not apply route as directed.   No facility-administered encounter medications on file as of 01/02/2017.    ALLERGIES: No Known Allergies VACCINATION STATUS: Immunization History  Administered Date(s) Administered  . Influenza Whole 09/09/2007, 08/11/2008    Diabetes  She presents for her follow-up diabetic visit. She has type 2 diabetes mellitus. Onset time: She was diagnosed at approximate age of 51 years. Her disease course has  been worsening. There are no hypoglycemic associated symptoms. Pertinent negatives for hypoglycemia include no confusion, headaches, pallor or seizures. Associated symptoms include blurred vision, fatigue, polydipsia and polyuria. Pertinent negatives for diabetes include no chest pain and no polyphagia. There are no hypoglycemic complications. Symptoms are worsening. There are no diabetic complications. Risk factors for coronary artery disease include diabetes mellitus, hypertension, obesity, sedentary lifestyle and family history. Current diabetic treatments: She is on glipizide 10 mg 2 times daily and metformin 1000 mg by mouth twice a day. Her weight is stable. She is following a generally unhealthy diet. When asked about meal planning, she reported none. She has not had a previous visit with a dietitian. She rarely participates in exercise. Her breakfast blood glucose range is generally >200 mg/dl. Her lunch blood glucose range is generally >200 mg/dl. Her dinner blood glucose range is generally >200 mg/dl. Her overall blood glucose range is >200 mg/dl. An ACE inhibitor/angiotensin II receptor blocker is being taken. Eye exam is current.  Hypertension  This is a chronic problem. The current episode started more than 1 year ago. The problem is controlled. Associated symptoms include blurred vision. Pertinent negatives include no chest pain, headaches, palpitations or shortness of breath. Risk factors for coronary artery disease include dyslipidemia, diabetes mellitus, obesity and sedentary lifestyle. Past treatments include angiotensin blockers.     Review of Systems  Constitutional: Positive for fatigue. Negative for  chills, fever and unexpected weight change.  HENT: Negative for trouble swallowing and voice change.   Eyes: Positive for blurred vision. Negative for visual disturbance.  Respiratory: Negative for cough, shortness of breath and wheezing.   Cardiovascular: Negative for chest pain,  palpitations and leg swelling.  Gastrointestinal: Negative for diarrhea, nausea and vomiting.  Endocrine: Positive for polydipsia and polyuria. Negative for cold intolerance, heat intolerance and polyphagia.  Musculoskeletal: Negative for arthralgias and myalgias.  Skin: Negative for color change, pallor, rash and wound.  Neurological: Negative for seizures and headaches.  Psychiatric/Behavioral: Negative for confusion and suicidal ideas.    Objective:    BP 140/82   Pulse 97   Resp 18   Ht 5\' 4"  (1.626 m)   Wt 247 lb (112 kg)   SpO2 99%   BMI 42.40 kg/m   Wt Readings from Last 3 Encounters:  01/02/17 247 lb (112 kg)  12/19/16 248 lb (112.5 kg)  03/03/15 252 lb (114.3 kg)    Physical Exam  Constitutional: She is oriented to person, place, and time. She appears well-developed.  HENT:  Head: Normocephalic and atraumatic.  Eyes: EOM are normal.  Neck: Normal range of motion. Neck supple. No tracheal deviation present. No thyromegaly present.  Cardiovascular: Normal rate and regular rhythm.   Pulmonary/Chest: Effort normal and breath sounds normal.  Abdominal: Soft. Bowel sounds are normal. There is no tenderness. There is no guarding.  Musculoskeletal: Normal range of motion. She exhibits no edema.  Neurological: She is alert and oriented to person, place, and time. She has normal reflexes. No cranial nerve deficit. Coordination normal.  Skin: Skin is warm and dry. No rash noted. No erythema. No pallor.  Psychiatric: She has a normal mood and affect. Judgment normal.    CMP     Component Value Date/Time   NA 139 03/03/2009 0106   K 3.1 (L) 03/03/2009 0106   CL 103 03/03/2009 0106   CO2 24 03/03/2009 0106   GLUCOSE 112 (H) 03/03/2009 0106   BUN 11 03/03/2009 0106   CREATININE 0.75 03/03/2009 0106   CALCIUM 9.1 03/03/2009 0106   PROT 7.3 03/03/2009 0106   ALBUMIN 3.7 03/03/2009 0106   AST 19 03/03/2009 0106   ALT 23 03/03/2009 0106   ALKPHOS 70 03/03/2009 0106    BILITOT 0.5 03/03/2009 0106     Diabetic Labs (most recent): Lab Results  Component Value Date   HGBA1C 14 10/23/2016   HGBA1C 6.8 07/06/2009   HGBA1C 7.2 03/02/2009     Lipid Panel ( most recent) Lipid Panel     Component Value Date/Time   CHOL 141 03/03/2009 0106   TRIG 73 03/03/2009 0106   HDL 41 03/03/2009 0106   CHOLHDL 3.4 Ratio 03/03/2009 0106   VLDL 15 03/03/2009 0106   LDLCALC 85 03/03/2009 0106     Assessment & Plan:   1. Uncontrolled type 2 diabetes mellitus with complication, without long-term current use of insulin ([place])  - Patient has currently uncontrolled symptomatic type 2 DM since  51 years of age,  with most recent A1c of greater than 14 %. Recent labs reviewed.   She does not report gross complications, however, patient remains at a high risk for more acute and chronic complications of diabetes which include CAD, CVA, CKD, retinopathy, and neuropathy. These are all discussed in detail with the patient.  - I have counseled the patient on diet management and weight loss, by adopting a carbohydrate restricted/protein rich diet.  - Suggestion  is made for patient to avoid simple carbohydrates   from their diet including Cakes , Desserts, Ice Cream,  Soda (  diet and regular) , Sweet Tea , Candies,  Chips, Cookies, Artificial Sweeteners,   and "Sugar-free" Products . This will help patient to have stable blood glucose profile and potentially avoid unintended weight gain.  - I encouraged the patient to switch to  unprocessed or minimally processed complex starch and increased protein intake (animal or plant source), fruits, and vegetables.  - Patient is advised to stick to a routine mealtimes to eat 3 meals  a day and avoid unnecessary snacks ( to snack only to correct hypoglycemia).  - The patient will be scheduled with Jearld Fenton, RDN, CDE for individualized DM education.  - I have approached patient with the following individualized plan to manage diabetes  and patient agrees:   - Based on her current glycemic burden she will need insulin treatment. - She is engaged to monitor properly. - I discussed and initiated basal insulin with Toujeo 20 units daily at bedtime associated with strict monitoring of glucose 4 times a day-before meals and at bedtime and return in one week with her meter and logs to review, and  makeadjustment.  -Patient is encouraged to call clinic for blood glucose levels less than 70 or above 300 mg /dl. - I will continue  metformin 1000 mg by mouth twice a day, therapeutically suitable for patient. - I will continue  Jardiance 10 mg by mouth every morning. Side effects and precautions discussed with her. It will be increased to 25 mg next prescription.  - Patient will be considered for incretin therapy as appropriate next visit. - Patient specific target  A1c;  LDL, HDL, Triglycerides, and  Waist Circumference were discussed in detail.  2) BP/HTN:  Controlled. Continue current medications including ARB. 3) Lipids/HPL:   Current control unknown, she is not on statin medications. I'll proceed to obtain fasting lipid panel on subsequent visits. 4)  Weight/Diet: CDE Consult will be initiated , exercise, and detailed carbohydrates information provided.  5) Chronic Care/Health Maintenance:  -Patient is on ARB and  encouraged to continue to follow up with Ophthalmology, Podiatrist at least yearly or according to recommendations, and advised to   stay away from smoking. I have recommended yearly flu vaccine and pneumonia vaccination at least every 5 years; moderate intensity exercise for up to 150 minutes weekly; and  sleep for at least 7 hours a day.  - 30 minutes of time was spent on the care of this patient , 50% of which was applied for counseling on diabetes complications and their preventions.  - Patient to bring meter and  blood glucose logs during her next visit.  - I advised patient to maintain close follow up with  Emily Filbert, PA-C (Inactive) for primary care needs.  Follow up plan: - Return in about 1 week (around 01/09/2017) for follow up with meter and logs- no labs.  Glade Lloyd, MD Phone: [phone]  Fax: [phone]   01/02/2017, 4:06 PM

## 2017-01-02 NOTE — Patient Instructions (Signed)

## 2017-01-10 ENCOUNTER — Other Ambulatory Visit: Payer: Self-pay

## 2017-01-10 MED ORDER — FLUCONAZOLE 150 MG PO TABS: 150.0000 mg | ORAL_TABLET | Freq: Once | ORAL | 1 refills | Status: AC

## 2017-01-15 ENCOUNTER — Ambulatory Visit: Payer: 59 | Admitting: "Endocrinology

## 2017-01-16 ENCOUNTER — Telehealth: Payer: Self-pay | Admitting: "Endocrinology

## 2017-01-16 MED ORDER — EMPAGLIFLOZIN 25 MG PO TABS: 25.0000 mg | ORAL_TABLET | Freq: Every day | ORAL | 2 refills | Status: DC

## 2017-01-16 NOTE — Telephone Encounter (Signed)
Patient states that Dr.Nida told her he was changing her dosage to 25 mg on her Jardience but [facility] did not have anything new for her when she went to pick it up today.

## 2017-01-21 ENCOUNTER — Ambulatory Visit (INDEPENDENT_AMBULATORY_CARE_PROVIDER_SITE_OTHER): Payer: 59 | Admitting: "Endocrinology

## 2017-01-21 ENCOUNTER — Encounter: Payer: Self-pay | Admitting: "Endocrinology

## 2017-01-21 VITALS — BP 123/85 | HR 82 | Ht 64.0 in | Wt 246.0 lb

## 2017-01-21 DIAGNOSIS — I10 Essential (primary) hypertension: Secondary | ICD-10-CM | POA: Diagnosis not present

## 2017-01-21 DIAGNOSIS — E1165 Type 2 diabetes mellitus with hyperglycemia: Secondary | ICD-10-CM

## 2017-01-21 DIAGNOSIS — E118 Type 2 diabetes mellitus with unspecified complications: Secondary | ICD-10-CM

## 2017-01-21 DIAGNOSIS — IMO0002 Reserved for concepts with insufficient information to code with codable children: Secondary | ICD-10-CM

## 2017-01-21 MED ORDER — INSULIN GLARGINE 300 UNIT/ML ~~LOC~~ SOPN: 30.0000 [IU] | PEN_INJECTOR | Freq: Every day | SUBCUTANEOUS | 2 refills | Status: DC

## 2017-01-21 NOTE — Patient Instructions (Signed)

## 2017-01-21 NOTE — Progress Notes (Signed)
Subjective:    Patient ID: Jillian Adkins, female    DOB: June 30, 1966. Patient is being seen in f/u for management of diabetes requested by  Emily Filbert, PA-C (Inactive)  Past Medical History:  Diagnosis Date  . Diabetes mellitus without complication ([place])   . Hypertension    Past Surgical History:  Procedure Laterality Date  . CESAREAN SECTION  [phone]  . TUBAL LIGATION     Social History   Social History  . Marital status: Married    Spouse name: N/A  . Number of children: N/A  . Years of education: N/A   Social History Main Topics  . Smoking status: Never Smoker  . Smokeless tobacco: Never Used  . Alcohol use No  . Drug use: No  . Sexual activity: Yes    Birth control/ protection: Surgical     Comment: tubal   Other Topics Concern  . None   Social History Narrative  . None   Outpatient Encounter Prescriptions as of 01/21/2017  Medication Sig  . amLODipine (NORVASC) 10 MG tablet Take 10 mg by mouth daily.  . empagliflozin (JARDIANCE) 25 MG TABS tablet Take 25 mg by mouth daily.  . Insulin Glargine (TOUJEO SOLOSTAR) 300 UNIT/ML SOPN Inject 30 Units into the skin at bedtime.  . Insulin Pen Needle (B-D ULTRAFINE III SHORT PEN) 31G X 8 MM MISC 1 each by Does not apply route as directed.  . metFORMIN (GLUCOPHAGE) 1000 MG tablet Take 1,000 mg by mouth 2 (two) times daily with a meal.  . olmesartan-hydrochlorothiazide (BENICAR HCT) 40-25 MG tablet Take 1 tablet by mouth daily.  Marland Kitchen spironolactone (ALDACTONE) 50 MG tablet Take 50 mg by mouth daily.  . [DISCONTINUED] Insulin Glargine (TOUJEO SOLOSTAR) 300 UNIT/ML SOPN Inject 20 Units into the skin at bedtime.   No facility-administered encounter medications on file as of 01/21/2017.    ALLERGIES: No Known Allergies VACCINATION STATUS: Immunization History  Administered Date(s) Administered  . Influenza Whole 09/09/2007, 08/11/2008    Diabetes  She presents for her follow-up diabetic visit. She has type 2  diabetes mellitus. Onset time: She was diagnosed at approximate age of 51 years. Her disease course has been improving. There are no hypoglycemic associated symptoms. Pertinent negatives for hypoglycemia include no confusion, headaches, pallor or seizures. Associated symptoms include blurred vision, fatigue, polydipsia and polyuria. Pertinent negatives for diabetes include no chest pain and no polyphagia. There are no hypoglycemic complications. Symptoms are improving. There are no diabetic complications. Risk factors for coronary artery disease include diabetes mellitus, hypertension, obesity, sedentary lifestyle and family history. Current diabetic treatments: She is on glipizide 10 mg 2 times daily and metformin 1000 mg by mouth twice a day. Her weight is decreasing steadily. She is following a generally unhealthy diet. When asked about meal planning, she reported none. She has not had a previous visit with a dietitian. She rarely participates in exercise. Her breakfast blood glucose range is generally 180-200 mg/dl. Her lunch blood glucose range is generally 180-200 mg/dl. Her dinner blood glucose range is generally 180-200 mg/dl. Her overall blood glucose range is 180-200 mg/dl. An ACE inhibitor/angiotensin II receptor blocker is being taken. Eye exam is current.  Hypertension  This is a chronic problem. The current episode started more than 1 year ago. The problem is controlled. Associated symptoms include blurred vision. Pertinent negatives include no chest pain, headaches, palpitations or shortness of breath. Risk factors for coronary artery disease include dyslipidemia, diabetes mellitus, obesity and sedentary lifestyle.  Past treatments include angiotensin blockers.     Review of Systems  Constitutional: Positive for fatigue. Negative for chills, fever and unexpected weight change.  HENT: Negative for trouble swallowing and voice change.   Eyes: Positive for blurred vision. Negative for visual  disturbance.  Respiratory: Negative for cough, shortness of breath and wheezing.   Cardiovascular: Negative for chest pain, palpitations and leg swelling.  Gastrointestinal: Negative for diarrhea, nausea and vomiting.  Endocrine: Positive for polydipsia and polyuria. Negative for cold intolerance, heat intolerance and polyphagia.  Musculoskeletal: Negative for arthralgias and myalgias.  Skin: Negative for color change, pallor, rash and wound.  Neurological: Negative for seizures and headaches.  Psychiatric/Behavioral: Negative for confusion and suicidal ideas.    Objective:    BP 123/85   Pulse 82   Ht 5\' 4"  (1.626 m)   Wt 246 lb (111.6 kg)   BMI 42.23 kg/m   Wt Readings from Last 3 Encounters:  01/21/17 246 lb (111.6 kg)  01/02/17 247 lb (112 kg)  12/19/16 248 lb (112.5 kg)    Physical Exam  Constitutional: She is oriented to person, place, and time. She appears well-developed.  HENT:  Head: Normocephalic and atraumatic.  Eyes: EOM are normal.  Neck: Normal range of motion. Neck supple. No tracheal deviation present. No thyromegaly present.  Cardiovascular: Normal rate and regular rhythm.   Pulmonary/Chest: Effort normal and breath sounds normal.  Abdominal: Soft. Bowel sounds are normal. There is no tenderness. There is no guarding.  Musculoskeletal: Normal range of motion. She exhibits no edema.  Neurological: She is alert and oriented to person, place, and time. She has normal reflexes. No cranial nerve deficit. Coordination normal.  Skin: Skin is warm and dry. No rash noted. No erythema. No pallor.  Psychiatric: She has a normal mood and affect. Judgment normal.    CMP     Component Value Date/Time   NA 139 03/03/2009 0106   K 3.1 (L) 03/03/2009 0106   CL 103 03/03/2009 0106   CO2 24 03/03/2009 0106   GLUCOSE 112 (H) 03/03/2009 0106   BUN 11 03/03/2009 0106   CREATININE 0.75 03/03/2009 0106   CALCIUM 9.1 03/03/2009 0106   PROT 7.3 03/03/2009 0106   ALBUMIN 3.7  03/03/2009 0106   AST 19 03/03/2009 0106   ALT 23 03/03/2009 0106   ALKPHOS 70 03/03/2009 0106   BILITOT 0.5 03/03/2009 0106     Diabetic Labs (most recent): Lab Results  Component Value Date   HGBA1C 14 10/23/2016   HGBA1C 6.8 07/06/2009   HGBA1C 7.2 03/02/2009     Lipid Panel ( most recent) Lipid Panel     Component Value Date/Time   CHOL 141 03/03/2009 0106   TRIG 73 03/03/2009 0106   HDL 41 03/03/2009 0106   CHOLHDL 3.4 Ratio 03/03/2009 0106   VLDL 15 03/03/2009 0106   LDLCALC 85 03/03/2009 0106     Assessment & Plan:   1. Uncontrolled type 2 diabetes mellitus with complication, without long-term current use of insulin ([place])  - Patient has currently uncontrolled symptomatic type 2 DM since  51 years of age,  with most recent A1c of greater than 14 %. Recent labs reviewed.   She does not report gross complications, however, patient remains at a high risk for more acute and chronic complications of diabetes which include CAD, CVA, CKD, retinopathy, and neuropathy. These are all discussed in detail with the patient.  - I have counseled the patient on diet management and weight  loss, by adopting a carbohydrate restricted/protein rich diet.  - Suggestion is made for patient to avoid simple carbohydrates   from their diet including Cakes , Desserts, Ice Cream,  Soda (  diet and regular) , Sweet Tea , Candies,  Chips, Cookies, Artificial Sweeteners,   and "Sugar-free" Products . This will help patient to have stable blood glucose profile and potentially avoid unintended weight gain.  - I encouraged the patient to switch to  unprocessed or minimally processed complex starch and increased protein intake (animal or plant source), fruits, and vegetables.  - Patient is advised to stick to a routine mealtimes to eat 3 meals  a day and avoid unnecessary snacks ( to snack only to correct hypoglycemia).  - The patient will be scheduled with Jearld Fenton, RDN, CDE for individualized  DM education.  - I have approached patient with the following individualized plan to manage diabetes and patient agrees:   - Based on her current glycemic burden she will continue to  need insulin treatment. - She is engaged to monitor properly. - I will increase Toujeo to 30 units daily at bedtime associated with strict monitoring of glucose daily before breakfast and at bedtime.  -Patient is encouraged to call clinic for blood glucose levels less than 70 or above 300 mg /dl. - I will continue  metformin 1000 mg by mouth twice a day, therapeutically suitable for patient. - I will continue  Jardiance 25 mg by mouth every morning. Side effects and precautions discussed with her.  - Patient will be considered for incretin therapy as appropriate next visit. - Patient specific target  A1c;  LDL, HDL, Triglycerides, and  Waist Circumference were discussed in detail.  2) BP/HTN:  Controlled. Continue current medications including ARB. 3) Lipids/HPL:   Current control unknown, she is not on statin medications. I'll proceed to obtain fasting lipid panel on subsequent visits. 4)  Weight/Diet: CDE Consult will be initiated , exercise, and detailed carbohydrates information provided.  5) Chronic Care/Health Maintenance:  -Patient is on ARB and  encouraged to continue to follow up with Ophthalmology, Podiatrist at least yearly or according to recommendations, and advised to   stay away from smoking. I have recommended yearly flu vaccine and pneumonia vaccination at least every 5 years; moderate intensity exercise for up to 150 minutes weekly; and  sleep for at least 7 hours a day.  - 30 minutes of time was spent on the care of this patient , 50% of which was applied for counseling on diabetes complications and their preventions.  - Patient to bring meter and  blood glucose logs during her next visit.  - I advised patient to maintain close follow up with Emily Filbert, PA-C (Inactive) for primary care  needs.  Follow up plan: - Return in about 5 weeks (around 02/25/2017) for meter, and logs.  Glade Lloyd, MD Phone: [phone]  Fax: [phone]   01/21/2017, 3:08 PM

## 2017-02-23 ENCOUNTER — Other Ambulatory Visit: Payer: Self-pay | Admitting: "Endocrinology

## 2017-02-23 LAB — COMPREHENSIVE METABOLIC PANEL
ALK PHOS: 83 U/L (ref 33–115)
ALT: 13 U/L (ref 6–29)
AST: 12 U/L (ref 10–35)
Albumin: 3.8 g/dL (ref 3.6–5.1)
BUN: 25 mg/dL (ref 7–25)
CHLORIDE: 104 mmol/L (ref 98–110)
CO2: 23 mmol/L (ref 20–31)
Calcium: 9.4 mg/dL (ref 8.6–10.2)
Creat: 1.41 mg/dL — ABNORMAL HIGH (ref 0.50–1.10)
GLUCOSE: 98 mg/dL (ref 65–99)
POTASSIUM: 3.6 mmol/L (ref 3.5–5.3)
SODIUM: 138 mmol/L (ref 135–146)
Total Bilirubin: 0.7 mg/dL (ref 0.2–1.2)
Total Protein: 7.1 g/dL (ref 6.1–8.1)

## 2017-02-23 LAB — LIPID PANEL
Cholesterol: 142 mg/dL (ref <200)
HDL: 36 mg/dL — ABNORMAL LOW (ref >50)
LDL CALC: 93 mg/dL (ref <100)
Total CHOL/HDL Ratio: 3.9 Ratio (ref <5.0)
Triglycerides: 66 mg/dL (ref <150)
VLDL: 13 mg/dL (ref <30)

## 2017-02-24 LAB — MICROALBUMIN / CREATININE URINE RATIO
Creatinine, Urine: 201 mg/dL (ref 20–320)
MICROALB UR: 3.3 mg/dL
MICROALB/CREAT RATIO: 16 ug/mg{creat} (ref <30)

## 2017-02-24 LAB — T4, FREE: Free T4: 1.2 ng/dL (ref 0.8–1.8)

## 2017-02-24 LAB — TSH: TSH: 1.19 m[IU]/L

## 2017-02-25 ENCOUNTER — Ambulatory Visit (INDEPENDENT_AMBULATORY_CARE_PROVIDER_SITE_OTHER): Payer: 59 | Admitting: "Endocrinology

## 2017-02-25 ENCOUNTER — Encounter: Payer: Self-pay | Admitting: "Endocrinology

## 2017-02-25 VITALS — BP 133/87 | HR 76 | Ht 64.0 in | Wt 248.0 lb

## 2017-02-25 DIAGNOSIS — E118 Type 2 diabetes mellitus with unspecified complications: Secondary | ICD-10-CM | POA: Diagnosis not present

## 2017-02-25 DIAGNOSIS — E1165 Type 2 diabetes mellitus with hyperglycemia: Secondary | ICD-10-CM

## 2017-02-25 DIAGNOSIS — I10 Essential (primary) hypertension: Secondary | ICD-10-CM | POA: Diagnosis not present

## 2017-02-25 DIAGNOSIS — IMO0002 Reserved for concepts with insufficient information to code with codable children: Secondary | ICD-10-CM

## 2017-02-25 LAB — HEMOGLOBIN A1C
Hgb A1c MFr Bld: 8.4 % — ABNORMAL HIGH (ref <5.7)
MEAN PLASMA GLUCOSE: 194 mg/dL

## 2017-02-25 NOTE — Progress Notes (Signed)
Subjective:    Patient ID: Jillian Adkins, female    DOB: January 23, 1966. Patient is being seen in f/u for management of diabetes requested by  Emily Filbert, PA-C (Inactive)  Past Medical History:  Diagnosis Date  . Diabetes mellitus without complication ([place])   . Hypertension    Past Surgical History:  Procedure Laterality Date  . CESAREAN SECTION  [phone]  . TUBAL LIGATION     Social History   Social History  . Marital status: Married    Spouse name: N/A  . Number of children: N/A  . Years of education: N/A   Social History Main Topics  . Smoking status: Never Smoker  . Smokeless tobacco: Never Used  . Alcohol use No  . Drug use: No  . Sexual activity: Yes    Birth control/ protection: Surgical     Comment: tubal   Other Topics Concern  . None   Social History Narrative  . None   Outpatient Encounter Prescriptions as of 02/25/2017  Medication Sig  . amLODipine (NORVASC) 10 MG tablet Take 10 mg by mouth daily.  . empagliflozin (JARDIANCE) 25 MG TABS tablet Take 25 mg by mouth daily.  . Insulin Glargine (TOUJEO SOLOSTAR) 300 UNIT/ML SOPN Inject 30 Units into the skin at bedtime.  . Insulin Pen Needle (B-D ULTRAFINE III SHORT PEN) 31G X 8 MM MISC 1 each by Does not apply route as directed.  . metFORMIN (GLUCOPHAGE) 1000 MG tablet Take 1,000 mg by mouth 2 (two) times daily with a meal.  . olmesartan-hydrochlorothiazide (BENICAR HCT) 40-25 MG tablet Take 1 tablet by mouth daily.  Marland Kitchen spironolactone (ALDACTONE) 50 MG tablet Take 50 mg by mouth daily.   No facility-administered encounter medications on file as of 02/25/2017.    ALLERGIES: No Known Allergies VACCINATION STATUS: Immunization History  Administered Date(s) Administered  . Influenza Whole 09/09/2007, 08/11/2008    Diabetes  She presents for her follow-up diabetic visit. She has type 2 diabetes mellitus. Onset time: She was diagnosed at approximate age of 56 years. Her disease course has been  improving. There are no hypoglycemic associated symptoms. Pertinent negatives for hypoglycemia include no confusion, headaches, pallor or seizures. Associated symptoms include blurred vision, fatigue, polydipsia and polyuria. Pertinent negatives for diabetes include no chest pain and no polyphagia. There are no hypoglycemic complications. Symptoms are improving. There are no diabetic complications. Risk factors for coronary artery disease include diabetes mellitus, hypertension, obesity, sedentary lifestyle and family history. Current diabetic treatments: She is on glipizide 10 mg 2 times daily and metformin 1000 mg by mouth twice a day. Her weight is stable. She is following a generally unhealthy diet. When asked about meal planning, she reported none. She has not had a previous visit with a dietitian. She rarely participates in exercise. Her breakfast blood glucose range is generally 140-180 mg/dl. Her dinner blood glucose range is generally 180-200 mg/dl. Her overall blood glucose range is 180-200 mg/dl. An ACE inhibitor/angiotensin II receptor blocker is being taken. Eye exam is current.  Hypertension  This is a chronic problem. The current episode started more than 1 year ago. The problem is controlled. Associated symptoms include blurred vision. Pertinent negatives include no chest pain, headaches, palpitations or shortness of breath. Risk factors for coronary artery disease include dyslipidemia, diabetes mellitus, obesity and sedentary lifestyle. Past treatments include angiotensin blockers.     Review of Systems  Constitutional: Positive for fatigue. Negative for chills, fever and unexpected weight change.  HENT: Negative  for trouble swallowing and voice change.   Eyes: Positive for blurred vision. Negative for visual disturbance.  Respiratory: Negative for cough, shortness of breath and wheezing.   Cardiovascular: Negative for chest pain, palpitations and leg swelling.  Gastrointestinal:  Negative for diarrhea, nausea and vomiting.  Endocrine: Positive for polydipsia and polyuria. Negative for cold intolerance, heat intolerance and polyphagia.  Musculoskeletal: Negative for arthralgias and myalgias.  Skin: Negative for color change, pallor, rash and wound.  Neurological: Negative for seizures and headaches.  Psychiatric/Behavioral: Negative for confusion and suicidal ideas.    Objective:    BP 133/87   Pulse 76   Ht 5\' 4"  (1.626 m)   Wt 248 lb (112.5 kg)   BMI 42.57 kg/m   Wt Readings from Last 3 Encounters:  02/25/17 248 lb (112.5 kg)  01/21/17 246 lb (111.6 kg)  01/02/17 247 lb (112 kg)    Physical Exam  Constitutional: She is oriented to person, place, and time. She appears well-developed.  HENT:  Head: Normocephalic and atraumatic.  Eyes: EOM are normal.  Neck: Normal range of motion. Neck supple. No tracheal deviation present. No thyromegaly present.  Cardiovascular: Normal rate and regular rhythm.   Pulmonary/Chest: Effort normal and breath sounds normal.  Abdominal: Soft. Bowel sounds are normal. There is no tenderness. There is no guarding.  Musculoskeletal: Normal range of motion. She exhibits no edema.  Neurological: She is alert and oriented to person, place, and time. She has normal reflexes. No cranial nerve deficit. Coordination normal.  Skin: Skin is warm and dry. No rash noted. No erythema. No pallor.  Psychiatric: She has a normal mood and affect. Judgment normal.    CMP     Component Value Date/Time   NA 138 02/23/2017 0852   K 3.6 02/23/2017 0852   CL 104 02/23/2017 0852   CO2 23 02/23/2017 0852   GLUCOSE 98 02/23/2017 0852   BUN 25 02/23/2017 0852   CREATININE 1.41 (H) 02/23/2017 0852   CALCIUM 9.4 02/23/2017 0852   PROT 7.1 02/23/2017 0852   ALBUMIN 3.8 02/23/2017 0852   AST 12 02/23/2017 0852   ALT 13 02/23/2017 0852   ALKPHOS 83 02/23/2017 0852   BILITOT 0.7 02/23/2017 0852     Diabetic Labs (most recent): Lab Results   Component Value Date   HGBA1C 8.4 (H) 02/23/2017   HGBA1C 14 10/23/2016   HGBA1C 6.8 07/06/2009     Lipid Panel ( most recent) Lipid Panel     Component Value Date/Time   CHOL 142 02/23/2017 0852   TRIG 66 02/23/2017 0852   HDL 36 (L) 02/23/2017 0852   CHOLHDL 3.9 02/23/2017 0852   VLDL 13 02/23/2017 0852   LDLCALC 93 02/23/2017 0852     Assessment & Plan:   1. Uncontrolled type 2 diabetes mellitus with complication, without long-term current use of insulin ([place])  - Patient has currently uncontrolled symptomatic type 2 DM since  51 years of age. - Her A1c has improved to 8.4% from >14 %. Recent labs reviewed.   She does not report gross complications, however, patient remains at a high risk for more acute and chronic complications of diabetes which include CAD, CVA, CKD, retinopathy, and neuropathy. These are all discussed in detail with the patient.  - I have counseled the patient on diet management and weight loss, by adopting a carbohydrate restricted/protein rich diet.  - Suggestion is made for patient to avoid simple carbohydrates   from their diet including Cakes , Desserts,  Ice Cream,  Soda (  diet and regular) , Sweet Tea , Candies,  Chips, Cookies, Artificial Sweeteners,   and "Sugar-free" Products . This will help patient to have stable blood glucose profile and potentially avoid unintended weight gain.  - I encouraged the patient to switch to  unprocessed or minimally processed complex starch and increased protein intake (animal or plant source), fruits, and vegetables.  - Patient is advised to stick to a routine mealtimes to eat 3 meals  a day and avoid unnecessary snacks ( to snack only to correct hypoglycemia).  - The patient will be scheduled with Jearld Fenton, RDN, CDE for individualized DM education.  - I have approached patient with the following individualized plan to manage diabetes and patient agrees:   - Based on her current glycemic burden she will  continue to  need   at least basal insulin treatment. - She is engaged to monitor properly. - I will continue  Toujeo  30 units daily at bedtime associated with strict monitoring of glucose daily before breakfast and at bedtime.  -Patient is encouraged to call clinic for blood glucose levels less than 70 or above 300 mg /dl. - I will continue  metformin 1000 mg by mouth twice a day, therapeutically suitable for patient. - I will continue  Jardiance 25 mg by mouth every morning. Side effects and precautions discussed with her.  - Patient will be considered for incretin therapy as appropriate next visit. - Patient specific target  A1c;  LDL, HDL, Triglycerides, and  Waist Circumference were discussed in detail.  2) BP/HTN:  Controlled. Continue current medications including ARB. 3) Lipids/HPL:   Current control unknown, she is not on statin medications. I'll proceed to obtain fasting lipid panel on subsequent visits. 4)  Weight/Diet: CDE Consult will be initiated , exercise, and detailed carbohydrates information provided.  5) Chronic Care/Health Maintenance:  -Patient is on ARB and  encouraged to continue to follow up with Ophthalmology, Podiatrist at least yearly or according to recommendations, and advised to   stay away from smoking. I have recommended yearly flu vaccine and pneumonia vaccination at least every 5 years; moderate intensity exercise for up to 150 minutes weekly; and  sleep for at least 7 hours a day.  - 30 minutes of time was spent on the care of this patient , 50% of which was applied for counseling on diabetes complications and their preventions.  - Patient to bring meter and  blood glucose logs during her next visit.  - I advised patient to maintain close follow up with Emily Filbert, PA-C (Inactive) for primary care needs.  Follow up plan: - Return in about 3 months (around 05/27/2017) for follow up with pre-visit labs, meter, and logs.  Glade Lloyd, MD Phone:  [phone]  Fax: [phone]   02/25/2017, 3:31 PM

## 2017-02-25 NOTE — Patient Instructions (Signed)

## 2017-04-16 ENCOUNTER — Other Ambulatory Visit: Payer: Self-pay | Admitting: "Endocrinology

## 2017-06-01 ENCOUNTER — Other Ambulatory Visit: Payer: Self-pay | Admitting: "Endocrinology

## 2017-06-02 LAB — COMPREHENSIVE METABOLIC PANEL
ALT: 14 U/L (ref 6–29)
AST: 15 U/L (ref 10–35)
Albumin: 3.8 g/dL (ref 3.6–5.1)
Alkaline Phosphatase: 83 U/L (ref 33–115)
BUN: 22 mg/dL (ref 7–25)
CHLORIDE: 104 mmol/L (ref 98–110)
CO2: 23 mmol/L (ref 20–31)
CREATININE: 1.2 mg/dL — AB (ref 0.50–1.10)
Calcium: 9.4 mg/dL (ref 8.6–10.2)
GLUCOSE: 104 mg/dL — AB (ref 65–99)
Potassium: 4.2 mmol/L (ref 3.5–5.3)
SODIUM: 137 mmol/L (ref 135–146)
Total Bilirubin: 0.6 mg/dL (ref 0.2–1.2)
Total Protein: 7.2 g/dL (ref 6.1–8.1)

## 2017-06-03 LAB — HEMOGLOBIN A1C
Hgb A1c MFr Bld: 6.8 % — ABNORMAL HIGH (ref <5.7)
MEAN PLASMA GLUCOSE: 148 mg/dL

## 2017-06-05 ENCOUNTER — Encounter: Payer: Self-pay | Admitting: "Endocrinology

## 2017-06-05 ENCOUNTER — Ambulatory Visit (INDEPENDENT_AMBULATORY_CARE_PROVIDER_SITE_OTHER): Payer: 59 | Admitting: "Endocrinology

## 2017-06-05 VITALS — BP 133/82 | HR 76 | Ht 64.0 in | Wt 254.0 lb

## 2017-06-05 DIAGNOSIS — E1165 Type 2 diabetes mellitus with hyperglycemia: Secondary | ICD-10-CM | POA: Diagnosis not present

## 2017-06-05 DIAGNOSIS — I10 Essential (primary) hypertension: Secondary | ICD-10-CM

## 2017-06-05 DIAGNOSIS — E118 Type 2 diabetes mellitus with unspecified complications: Secondary | ICD-10-CM

## 2017-06-05 DIAGNOSIS — IMO0002 Reserved for concepts with insufficient information to code with codable children: Secondary | ICD-10-CM

## 2017-06-05 MED ORDER — INSULIN GLARGINE 300 UNIT/ML ~~LOC~~ SOPN: 20.0000 [IU] | PEN_INJECTOR | Freq: Every day | SUBCUTANEOUS | 2 refills | Status: DC

## 2017-06-05 NOTE — Progress Notes (Signed)
Subjective:    Patient ID: Jillian Adkins, female    DOB: May 22, 1966. Patient is being seen in f/u for management of diabetes requested by  Donnamae Jude, PA-C (Inactive)  Past Medical History:  Diagnosis Date  . Diabetes mellitus without complication ([place])   . Hypertension    Past Surgical History:  Procedure Laterality Date  . CESAREAN SECTION  [phone]  . TUBAL LIGATION     Social History   Social History  . Marital status: Married    Spouse name: N/A  . Number of children: N/A  . Years of education: N/A   Social History Main Topics  . Smoking status: Never Smoker  . Smokeless tobacco: Never Used  . Alcohol use No  . Drug use: No  . Sexual activity: Yes    Birth control/ protection: Surgical     Comment: tubal   Other Topics Concern  . None   Social History Narrative  . None   Outpatient Encounter Prescriptions as of 06/05/2017  Medication Sig  . amLODipine (NORVASC) 10 MG tablet Take 10 mg by mouth daily.  . Insulin Glargine (TOUJEO SOLOSTAR) 300 UNIT/ML SOPN Inject 20 Units into the skin at bedtime.  . Insulin Pen Needle (B-D ULTRAFINE III SHORT PEN) 31G X 8 MM MISC 1 each by Does not apply route as directed.  Marland Kitchen JARDIANCE 25 MG TABS tablet TAKE 1 TABLET BY MOUTH EVERY DAY  . metFORMIN (GLUCOPHAGE) 1000 MG tablet Take 1,000 mg by mouth 2 (two) times daily with a meal.  . olmesartan-hydrochlorothiazide (BENICAR HCT) 40-25 MG tablet Take 1 tablet by mouth daily.  Marland Kitchen spironolactone (ALDACTONE) 50 MG tablet Take 50 mg by mouth daily.  . [DISCONTINUED] Insulin Glargine (TOUJEO SOLOSTAR) 300 UNIT/ML SOPN Inject 30 Units into the skin at bedtime.   No facility-administered encounter medications on file as of 06/05/2017.    ALLERGIES: No Known Allergies VACCINATION STATUS: Immunization History  Administered Date(s) Administered  . Influenza Whole 09/09/2007, 08/11/2008    Diabetes  She presents for her follow-up diabetic visit. She has type 2 diabetes  mellitus. Onset time: She was diagnosed at approximate age of 25 years. Her disease course has been improving. There are no hypoglycemic associated symptoms. Pertinent negatives for hypoglycemia include no confusion, headaches, pallor or seizures. Associated symptoms include blurred vision, fatigue, polydipsia and polyuria. Pertinent negatives for diabetes include no chest pain and no polyphagia. There are no hypoglycemic complications. Symptoms are improving. There are no diabetic complications. Risk factors for coronary artery disease include diabetes mellitus, hypertension, obesity, sedentary lifestyle and family history. Her weight is increasing steadily. She is following a generally unhealthy diet. When asked about meal planning, she reported none. She has not had a previous visit with a dietitian. She rarely participates in exercise. Her breakfast blood glucose range is generally 140-180 mg/dl. Her dinner blood glucose range is generally 140-180 mg/dl. Her overall blood glucose range is 180-200 mg/dl. An ACE inhibitor/angiotensin II receptor blocker is being taken. Eye exam is current.  Hypertension  This is a chronic problem. The current episode started more than 1 year ago. The problem is controlled. Associated symptoms include blurred vision. Pertinent negatives include no chest pain, headaches, palpitations or shortness of breath. Risk factors for coronary artery disease include dyslipidemia, diabetes mellitus, obesity and sedentary lifestyle. Past treatments include angiotensin blockers.     Review of Systems  Constitutional: Positive for fatigue. Negative for chills, fever and unexpected weight change.  HENT: Negative for  trouble swallowing and voice change.   Eyes: Positive for blurred vision. Negative for visual disturbance.  Respiratory: Negative for cough, shortness of breath and wheezing.   Cardiovascular: Negative for chest pain, palpitations and leg swelling.  Gastrointestinal:  Negative for diarrhea, nausea and vomiting.  Endocrine: Positive for polydipsia and polyuria. Negative for cold intolerance, heat intolerance and polyphagia.  Musculoskeletal: Negative for arthralgias and myalgias.  Skin: Negative for color change, pallor, rash and wound.  Neurological: Negative for seizures and headaches.  Psychiatric/Behavioral: Negative for confusion and suicidal ideas.    Objective:    BP 133/82   Pulse 76   Ht 5\' 4"  (1.626 m)   Wt 254 lb (115.2 kg)   BMI 43.60 kg/m   Wt Readings from Last 3 Encounters:  06/05/17 254 lb (115.2 kg)  02/25/17 248 lb (112.5 kg)  01/21/17 246 lb (111.6 kg)    Physical Exam  Constitutional: She is oriented to person, place, and time. She appears well-developed.  HENT:  Head: Normocephalic and atraumatic.  Eyes: EOM are normal.  Neck: Normal range of motion. Neck supple. No tracheal deviation present. No thyromegaly present.  Cardiovascular: Normal rate and regular rhythm.   Pulmonary/Chest: Effort normal and breath sounds normal.  Abdominal: Soft. Bowel sounds are normal. There is no tenderness. There is no guarding.  Musculoskeletal: Normal range of motion. She exhibits no edema.  Neurological: She is alert and oriented to person, place, and time. She has normal reflexes. No cranial nerve deficit. Coordination normal.  Skin: Skin is warm and dry. No rash noted. No erythema. No pallor.  Psychiatric: She has a normal mood and affect. Judgment normal.    CMP     Component Value Date/Time   NA 137 06/01/2017 0851   K 4.2 06/01/2017 0851   CL 104 06/01/2017 0851   CO2 23 06/01/2017 0851   GLUCOSE 104 (H) 06/01/2017 0851   BUN 22 06/01/2017 0851   CREATININE 1.20 (H) 06/01/2017 0851   CALCIUM 9.4 06/01/2017 0851   PROT 7.2 06/01/2017 0851   ALBUMIN 3.8 06/01/2017 0851   AST 15 06/01/2017 0851   ALT 14 06/01/2017 0851   ALKPHOS 83 06/01/2017 0851   BILITOT 0.6 06/01/2017 0851     Diabetic Labs (most recent): Lab  Results  Component Value Date   HGBA1C 6.8 (H) 06/01/2017   HGBA1C 8.4 (H) 02/23/2017   HGBA1C 14 10/23/2016     Lipid Panel ( most recent) Lipid Panel     Component Value Date/Time   CHOL 142 02/23/2017 0852   TRIG 66 02/23/2017 0852   HDL 36 (L) 02/23/2017 0852   CHOLHDL 3.9 02/23/2017 0852   VLDL 13 02/23/2017 0852   LDLCALC 93 02/23/2017 0852     Assessment & Plan:   1. Uncontrolled type 2 diabetes mellitus with complication, without long-term current use of insulin ([place])  - Patient has currently uncontrolled symptomatic type 2 DM since  51 years of age. - Her A1c has improved to 6.8% from >14 %. Recent labs reviewed.   She does not report gross complications, however, patient remains at a high risk for more acute and chronic complications of diabetes which include CAD, CVA, CKD, retinopathy, and neuropathy. These are all discussed in detail with the patient.  - I have counseled the patient on diet management and weight loss, by adopting a carbohydrate restricted/protein rich diet.  - Suggestion is made for patient to avoid simple carbohydrates   from her diet including Cakes ,  Desserts, Ice Cream,  Soda (  diet and regular) , Sweet Tea , Candies,  Chips, Cookies, Artificial Sweeteners,   and "Sugar-free" Products . This will help patient to have stable blood glucose profile and potentially avoid unintended weight gain.  - I encouraged the patient to switch to  unprocessed or minimally processed complex starch and increased protein intake (animal or plant source), fruits, and vegetables.  - Patient is advised to stick to a routine mealtimes to eat 3 meals  a day and avoid unnecessary snacks ( to snack only to correct hypoglycemia).  - The patient will be scheduled with Jearld Fenton, RDN, CDE for individualized DM education.  - I have approached patient with the following individualized plan to manage diabetes and patient agrees:   - Based on her current glycemic burden  she will continue to  need   at least basal insulin treatment. - She is engaged to monitor properly. - I will decrease Toujeo  to 20 units daily at bedtime associated with strict monitoring of glucose daily before breakfast and at bedtime.  -Patient is encouraged to call clinic for blood glucose levels less than 70 or above 300 mg /dl. - I will continue  metformin 1000 mg by mouth twice a day, therapeutically suitable for patient. - I will continue  Jardiance 25 mg by mouth every morning. Side effects and precautions discussed with her.  - Patient will be considered for incretin therapy as appropriate next visit. - Patient specific target  A1c;  LDL, HDL, Triglycerides, and  Waist Circumference were discussed in detail.  2) BP/HTN:  Controlled. Continue current medications including ARB. 3) Lipids/HPL:   Current control unknown, she is not on statin medications. I'll proceed to obtain fasting lipid panel on subsequent visits. 4)  Weight/Diet:  No success in weight loss, CDE Consult in progress , exercise, and detailed carbohydrates information provided.  5) Chronic Care/Health Maintenance:  -Patient is on ARB and  encouraged to continue to follow up with Ophthalmology, Podiatrist at least yearly or according to recommendations, and advised to   stay away from smoking. I have recommended yearly flu vaccine and pneumonia vaccination at least every 5 years; moderate intensity exercise for up to 150 minutes weekly; and  sleep for at least 7 hours a day.  - 20 minutes of time was spent on the care of this patient , 50% of which was applied for counseling on diabetes complications and their preventions.  - Patient to bring meter and  blood glucose logs during her next visit.  - I advised patient to maintain close follow up with Donnamae Jude, PA-C (Inactive) for primary care needs.  Follow up plan: - Return in about 3 months (around 09/05/2017) for follow up with pre-visit labs, meter, and  logs.  Glade Lloyd, MD Phone: [phone]  Fax: [phone]   06/05/2017, 4:41 PM

## 2017-06-05 NOTE — Patient Instructions (Signed)

## 2017-06-19 ENCOUNTER — Other Ambulatory Visit: Payer: Self-pay | Admitting: "Endocrinology

## 2017-07-26 ENCOUNTER — Other Ambulatory Visit: Payer: Self-pay | Admitting: "Endocrinology

## 2017-09-09 ENCOUNTER — Ambulatory Visit: Payer: 59 | Admitting: "Endocrinology

## 2017-09-18 ENCOUNTER — Ambulatory Visit: Payer: 59 | Admitting: "Endocrinology

## 2017-09-24 LAB — RENAL FUNCTION PANEL
Albumin: 4 g/dL (ref 3.6–5.1)
BUN: 18 mg/dL (ref 7–25)
CHLORIDE: 108 mmol/L (ref 98–110)
CO2: 23 mmol/L (ref 20–32)
CREATININE: 1.08 mg/dL (ref 0.50–1.10)
Calcium: 9.6 mg/dL (ref 8.6–10.2)
Glucose, Bld: 100 mg/dL — ABNORMAL HIGH (ref 65–99)
POTASSIUM: 3.8 mmol/L (ref 3.5–5.3)
Phosphorus: 3.6 mg/dL (ref 2.5–4.5)
Sodium: 138 mmol/L (ref 135–146)

## 2017-09-24 LAB — HEMOGLOBIN A1C
HEMOGLOBIN A1C: 6.4 %{Hb} — AB (ref <5.7)
Mean Plasma Glucose: 137 (calc)
eAG (mmol/L): 7.6 (calc)

## 2017-09-26 ENCOUNTER — Encounter: Payer: Self-pay | Admitting: "Endocrinology

## 2017-09-26 ENCOUNTER — Ambulatory Visit (INDEPENDENT_AMBULATORY_CARE_PROVIDER_SITE_OTHER): Payer: 59 | Admitting: "Endocrinology

## 2017-09-26 VITALS — BP 140/81 | HR 74 | Ht 64.0 in | Wt 254.0 lb

## 2017-09-26 DIAGNOSIS — I10 Essential (primary) hypertension: Secondary | ICD-10-CM | POA: Diagnosis not present

## 2017-09-26 DIAGNOSIS — E1165 Type 2 diabetes mellitus with hyperglycemia: Secondary | ICD-10-CM

## 2017-09-26 DIAGNOSIS — IMO0002 Reserved for concepts with insufficient information to code with codable children: Secondary | ICD-10-CM

## 2017-09-26 DIAGNOSIS — E118 Type 2 diabetes mellitus with unspecified complications: Secondary | ICD-10-CM

## 2017-09-26 NOTE — Progress Notes (Signed)
Subjective:    Patient ID: Jillian Adkins, female    DOB: [birth year]. Patient is being seen in f/u for management of diabetes requested by  Donnamae Jude, PA-C (Inactive)  Past Medical History:  Diagnosis Date  . Diabetes mellitus without complication ([place])   . Hypertension    Past Surgical History:  Procedure Laterality Date  . CESAREAN SECTION  [phone]  . TUBAL LIGATION     Social History   Socioeconomic History  . Marital status: Married    Spouse name: None  . Number of children: None  . Years of education: None  . Highest education level: None  Social Needs  . Financial resource strain: None  . Food insecurity - worry: None  . Food insecurity - inability: None  . Transportation needs - medical: None  . Transportation needs - non-medical: None  Occupational History  . None  Tobacco Use  . Smoking status: Never Smoker  . Smokeless tobacco: Never Used  Substance and Sexual Activity  . Alcohol use: No  . Drug use: No  . Sexual activity: Yes    Birth control/protection: Surgical    Comment: tubal  Other Topics Concern  . None  Social History Narrative  . None   Outpatient Encounter Medications as of 09/26/2017  Medication Sig  . amLODipine (NORVASC) 10 MG tablet Take 10 mg by mouth daily.  . Insulin Pen Needle (B-D ULTRAFINE III SHORT PEN) 31G X 8 MM MISC 1 each by Does not apply route as directed.  . metFORMIN (GLUCOPHAGE) 1000 MG tablet Take 1,000 mg by mouth 2 (two) times daily with a meal.  . olmesartan-hydrochlorothiazide (BENICAR HCT) 40-25 MG tablet Take 1 tablet by mouth daily.  Marland Kitchen spironolactone (ALDACTONE) 50 MG tablet Take 50 mg by mouth daily.  . TOUJEO SOLOSTAR 300 UNIT/ML SOPN INJECT 20 UNITS INTO THE SKIN AT BEDTIME.  . [DISCONTINUED] Insulin Glargine (TOUJEO SOLOSTAR) 300 UNIT/ML SOPN Inject 20 Units into the skin at bedtime.  . [DISCONTINUED] JARDIANCE 25 MG TABS tablet TAKE 1 TABLET BY MOUTH EVERY DAY   No facility-administered  encounter medications on file as of 09/26/2017.    ALLERGIES: No Known Allergies VACCINATION STATUS: Immunization History  Administered Date(s) Administered  . Influenza Whole 09/09/2007, 08/11/2008    Diabetes  She presents for her follow-up diabetic visit. She has type 2 diabetes mellitus. Onset time: She was diagnosed at approximate age of 12 years. Her disease course has been improving. There are no hypoglycemic associated symptoms. Pertinent negatives for hypoglycemia include no confusion, headaches, pallor or seizures. There are no diabetic associated symptoms. Pertinent negatives for diabetes include no blurred vision, no chest pain, no fatigue, no polydipsia, no polyphagia and no polyuria. There are no hypoglycemic complications. Symptoms are improving. There are no diabetic complications. Risk factors for coronary artery disease include diabetes mellitus, hypertension, obesity, sedentary lifestyle and family history. Her weight is stable. She is following a generally unhealthy diet. When asked about meal planning, she reported none. She has not had a previous visit with a dietitian. She rarely participates in exercise. Her breakfast blood glucose range is generally 140-180 mg/dl. Her dinner blood glucose range is generally 180-200 mg/dl. Her overall blood glucose range is 180-200 mg/dl. An ACE inhibitor/angiotensin II receptor blocker is being taken. Eye exam is current.  Hypertension  This is a chronic problem. The current episode started more than 1 year ago. The problem is controlled. Pertinent negatives include no blurred vision, chest pain, headaches,  palpitations or shortness of breath. Risk factors for coronary artery disease include dyslipidemia, diabetes mellitus, obesity and sedentary lifestyle. Past treatments include angiotensin blockers.     Review of Systems  Constitutional: Negative for chills, fatigue, fever and unexpected weight change.  HENT: Negative for trouble  swallowing and voice change.   Eyes: Negative for blurred vision and visual disturbance.  Respiratory: Negative for cough, shortness of breath and wheezing.   Cardiovascular: Negative for chest pain, palpitations and leg swelling.  Gastrointestinal: Negative for diarrhea, nausea and vomiting.  Endocrine: Negative for cold intolerance, heat intolerance, polydipsia, polyphagia and polyuria.  Musculoskeletal: Negative for arthralgias and myalgias.  Skin: Negative for color change, pallor, rash and wound.  Neurological: Negative for seizures and headaches.  Psychiatric/Behavioral: Negative for confusion and suicidal ideas.    Objective:    BP 140/81   Pulse 74   Ht 5\' 4"  (1.626 m)   Wt 254 lb (115.2 kg)   BMI 43.60 kg/m   Wt Readings from Last 3 Encounters:  09/26/17 254 lb (115.2 kg)  06/05/17 254 lb (115.2 kg)  02/25/17 248 lb (112.5 kg)    Physical Exam  Constitutional: She is oriented to person, place, and time. She appears well-developed.  HENT:  Head: Normocephalic and atraumatic.  Eyes: EOM are normal.  Neck: Normal range of motion. Neck supple. No tracheal deviation present. No thyromegaly present.  Cardiovascular: Normal rate and regular rhythm.  Pulmonary/Chest: Effort normal and breath sounds normal.  Abdominal: Soft. Bowel sounds are normal. There is no tenderness. There is no guarding.  Musculoskeletal: Normal range of motion. She exhibits no edema.  Neurological: She is alert and oriented to person, place, and time. She has normal reflexes. No cranial nerve deficit. Coordination normal.  Skin: Skin is warm and dry. No rash noted. No erythema. No pallor.  Psychiatric: She has a normal mood and affect. Judgment normal.    CMP     Component Value Date/Time   NA 138 09/21/2017 0955   K 3.8 09/21/2017 0955   CL 108 09/21/2017 0955   CO2 23 09/21/2017 0955   GLUCOSE 100 (H) 09/21/2017 0955   BUN 18 09/21/2017 0955   CREATININE 1.08 09/21/2017 0955   CALCIUM 9.6  09/21/2017 0955   PROT 7.2 06/01/2017 0851   ALBUMIN 3.8 06/01/2017 0851   AST 15 06/01/2017 0851   ALT 14 06/01/2017 0851   ALKPHOS 83 06/01/2017 0851   BILITOT 0.6 06/01/2017 0851     Diabetic Labs (most recent): Lab Results  Component Value Date   HGBA1C 6.4 (H) 09/21/2017   HGBA1C 6.8 (H) 06/01/2017   HGBA1C 8.4 (H) 02/23/2017     Lipid Panel ( most recent) Lipid Panel     Component Value Date/Time   CHOL 142 02/23/2017 0852   TRIG 66 02/23/2017 0852   HDL 36 (L) 02/23/2017 0852   CHOLHDL 3.9 02/23/2017 0852   VLDL 13 02/23/2017 0852   LDLCALC 93 02/23/2017 0852     Assessment & Plan:   1. Uncontrolled type 2 diabetes mellitus with complication, without long-term current use of insulin ([place])  - Patient has currently uncontrolled symptomatic type 2 DM since  51 years of age. - Her A1c has improved to 6.4% from >14 %. Recent labs reviewed.   - She does not report gross complications, however, patient remains at a high risk for more acute and chronic complications of diabetes which include CAD, CVA, CKD, retinopathy, and neuropathy. These are all discussed in detail  with the patient.  - I have counseled the patient on diet management and weight loss, by adopting a carbohydrate restricted/protein rich diet.  -  Suggestion is made for her to avoid simple carbohydrates  from her diet including Cakes, Sweet Desserts / Pastries, Ice Cream, Soda (diet and regular), Sweet Tea, Candies, Chips, Cookies, Store Bought Juices, Alcohol in Excess of  1-2 drinks a day, Artificial Sweeteners, and "Sugar-free" Products. This will help patient to have stable blood glucose profile and potentially avoid unintended weight gain.   - I encouraged the patient to switch to  unprocessed or minimally processed complex starch and increased protein intake (animal or plant source), fruits, and vegetables.  - Patient is advised to stick to a routine mealtimes to eat 3 meals  a day and avoid  unnecessary snacks ( to snack only to correct hypoglycemia).   - I have approached patient with the following individualized plan to manage diabetes and patient agrees:   - Based on her current glycemic burden she will continue to  need   at least basal insulin treatment. - She is engaged to monitor properly. - I will continue Toujeo   20 units daily at bedtime associated with strict monitoring of glucose daily before breakfast and as needed.  -Patient is encouraged to call clinic for blood glucose levels less than 70 or above 200 mg /dl. - I will continue  metformin 1000 mg by mouth twice a day, therapeutically suitable for patient. - I will discontinue  Jardiance.  - Patient will be considered for incretin therapy as appropriate next visit. - Patient specific target  A1c;  LDL, HDL, Triglycerides, and  Waist Circumference were discussed in detail.  2) BP/HTN:  Controlled. Continue current medications including ARB. 3) Lipids/HPL:   Her LDL is controlled at 93, she is not on statin medications.  4)  Weight/Diet:  No success in weight loss, CDE Consult in progress , exercise, and detailed carbohydrates information provided.  5) Chronic Care/Health Maintenance:  -Patient is on ARB and  encouraged to continue to follow up with Ophthalmology, Podiatrist at least yearly or according to recommendations, and advised to   stay away from smoking. I have recommended yearly flu vaccine and pneumonia vaccination at least every 5 years; moderate intensity exercise for up to 150 minutes weekly; and  sleep for at least 7 hours a day.  - I advised patient to maintain close follow up with her PCP for primary care needs. - Time spent with the patient: 25 min, of which >50% was spent in reviewing her sugar logs , discussing her hypo- and hyper-glycemic episodes, reviewing her current and  previous labs and insulin doses and developing a plan to avoid hypo- and hyper-glycemia.   Follow up plan: - Return in  about 3 months (around 12/27/2017) for meter, and logs.  Glade Lloyd, MD Phone: [phone]  Fax: [phone]  -  This note was partially dictated with voice recognition software. Similar sounding words can be transcribed inadequately or may not  be corrected upon review.  09/26/2017, 3:54 PM

## 2017-09-26 NOTE — Patient Instructions (Signed)

## 2017-12-28 DIAGNOSIS — E1165 Type 2 diabetes mellitus with hyperglycemia: Secondary | ICD-10-CM | POA: Diagnosis not present

## 2017-12-28 DIAGNOSIS — E118 Type 2 diabetes mellitus with unspecified complications: Secondary | ICD-10-CM | POA: Diagnosis not present

## 2017-12-30 ENCOUNTER — Encounter: Payer: Self-pay | Admitting: "Endocrinology

## 2017-12-30 ENCOUNTER — Ambulatory Visit (INDEPENDENT_AMBULATORY_CARE_PROVIDER_SITE_OTHER): Payer: 59 | Admitting: "Endocrinology

## 2017-12-30 VITALS — BP 133/85 | HR 83 | Ht 64.0 in | Wt 257.0 lb

## 2017-12-30 DIAGNOSIS — E1165 Type 2 diabetes mellitus with hyperglycemia: Secondary | ICD-10-CM | POA: Diagnosis not present

## 2017-12-30 DIAGNOSIS — E118 Type 2 diabetes mellitus with unspecified complications: Secondary | ICD-10-CM

## 2017-12-30 DIAGNOSIS — I10 Essential (primary) hypertension: Secondary | ICD-10-CM | POA: Diagnosis not present

## 2017-12-30 DIAGNOSIS — IMO0002 Reserved for concepts with insufficient information to code with codable children: Secondary | ICD-10-CM

## 2017-12-30 LAB — COMPLETE METABOLIC PANEL WITH GFR
AG RATIO: 1.3 (calc) (ref 1.0–2.5)
ALT: 19 U/L (ref 6–29)
AST: 15 U/L (ref 10–35)
Albumin: 4 g/dL (ref 3.6–5.1)
Alkaline phosphatase (APISO): 89 U/L (ref 33–115)
BILIRUBIN TOTAL: 0.8 mg/dL (ref 0.2–1.2)
BUN: 15 mg/dL (ref 7–25)
CHLORIDE: 104 mmol/L (ref 98–110)
CO2: 27 mmol/L (ref 20–32)
Calcium: 9.3 mg/dL (ref 8.6–10.2)
Creat: 1.01 mg/dL (ref 0.50–1.10)
GFR, EST AFRICAN AMERICAN: 76 mL/min/{1.73_m2} (ref >=60)
GFR, Est Non African American: 65 mL/min/{1.73_m2} (ref >=60)
GLOBULIN: 3.2 g/dL (ref 1.9–3.7)
Glucose, Bld: 98 mg/dL (ref 65–99)
POTASSIUM: 3.7 mmol/L (ref 3.5–5.3)
SODIUM: 139 mmol/L (ref 135–146)
TOTAL PROTEIN: 7.2 g/dL (ref 6.1–8.1)

## 2017-12-30 LAB — HEMOGLOBIN A1C
Hgb A1c MFr Bld: 6.8 % of total Hgb — ABNORMAL HIGH (ref <5.7)
Mean Plasma Glucose: 148 (calc)
eAG (mmol/L): 8.2 (calc)

## 2017-12-30 MED ORDER — INSULIN DEGLUDEC 200 UNIT/ML ~~LOC~~ SOPN: 20.0000 [IU] | PEN_INJECTOR | Freq: Every day | SUBCUTANEOUS | 2 refills | Status: DC

## 2017-12-30 NOTE — Patient Instructions (Signed)

## 2017-12-30 NOTE — Progress Notes (Signed)
Subjective:    Patient ID: Jillian Adkins, female    DOB: 10-Jan-1966. Patient is being seen in f/u for management of diabetes requested by  Donnamae Jude, PA-C (Inactive)  Past Medical History:  Diagnosis Date  . Diabetes mellitus without complication ([place])   . Hypertension    Past Surgical History:  Procedure Laterality Date  . CESAREAN SECTION  [phone]  . TUBAL LIGATION     Social History   Socioeconomic History  . Marital status: Married    Spouse name: None  . Number of children: None  . Years of education: None  . Highest education level: None  Social Needs  . Financial resource strain: None  . Food insecurity - worry: None  . Food insecurity - inability: None  . Transportation needs - medical: None  . Transportation needs - non-medical: None  Occupational History  . None  Tobacco Use  . Smoking status: Never Smoker  . Smokeless tobacco: Never Used  Substance and Sexual Activity  . Alcohol use: No  . Drug use: No  . Sexual activity: Yes    Birth control/protection: Surgical    Comment: tubal  Other Topics Concern  . None  Social History Narrative  . None   Outpatient Encounter Medications as of 12/30/2017  Medication Sig  . amLODipine (NORVASC) 10 MG tablet Take 10 mg by mouth daily.  . Insulin Degludec (TRESIBA FLEXTOUCH) 200 UNIT/ML SOPN Inject 20 Units into the skin at bedtime.  . Insulin Pen Needle (B-D ULTRAFINE III SHORT PEN) 31G X 8 MM MISC 1 each by Does not apply route as directed.  . metFORMIN (GLUCOPHAGE) 1000 MG tablet Take 1,000 mg by mouth 2 (two) times daily with a meal.  . olmesartan-hydrochlorothiazide (BENICAR HCT) 40-25 MG tablet Take 1 tablet by mouth daily.  Marland Kitchen spironolactone (ALDACTONE) 50 MG tablet Take 50 mg by mouth daily.  . [DISCONTINUED] TOUJEO SOLOSTAR 300 UNIT/ML SOPN INJECT 20 UNITS INTO THE SKIN AT BEDTIME.   No facility-administered encounter medications on file as of 12/30/2017.    ALLERGIES: No Known  Allergies VACCINATION STATUS: Immunization History  Administered Date(s) Administered  . Influenza Whole 09/09/2007, 08/11/2008    Diabetes  She presents for her follow-up diabetic visit. She has type 2 diabetes mellitus. Onset time: She was diagnosed at approximate age of 6 years. Her disease course has been stable. There are no hypoglycemic associated symptoms. Pertinent negatives for hypoglycemia include no confusion, headaches, pallor or seizures. There are no diabetic associated symptoms. Pertinent negatives for diabetes include no blurred vision, no chest pain, no fatigue, no polydipsia, no polyphagia and no polyuria. There are no hypoglycemic complications. Symptoms are stable. There are no diabetic complications. Risk factors for coronary artery disease include diabetes mellitus, hypertension, obesity, sedentary lifestyle and family history. Her weight is stable. She is following a generally unhealthy diet. When asked about meal planning, she reported none. She has not had a previous visit with a dietitian. She rarely participates in exercise. Her breakfast blood glucose range is generally 140-180 mg/dl. Her bedtime blood glucose range is generally 140-180 mg/dl. Her overall blood glucose range is 140-180 mg/dl. An ACE inhibitor/angiotensin II receptor blocker is being taken. Eye exam is current.  Hypertension  This is a chronic problem. The current episode started more than 1 year ago. The problem is controlled. Pertinent negatives include no blurred vision, chest pain, headaches, palpitations or shortness of breath. Risk factors for coronary artery disease include dyslipidemia, diabetes mellitus,  obesity and sedentary lifestyle. Past treatments include angiotensin blockers.     Review of Systems  Constitutional: Negative for chills, fatigue, fever and unexpected weight change.  HENT: Negative for trouble swallowing and voice change.   Eyes: Negative for blurred vision and visual  disturbance.  Respiratory: Negative for cough, shortness of breath and wheezing.   Cardiovascular: Negative for chest pain, palpitations and leg swelling.  Gastrointestinal: Negative for diarrhea, nausea and vomiting.  Endocrine: Negative for cold intolerance, heat intolerance, polydipsia, polyphagia and polyuria.  Musculoskeletal: Negative for arthralgias and myalgias.  Skin: Negative for color change, pallor, rash and wound.  Neurological: Negative for seizures and headaches.  Psychiatric/Behavioral: Negative for confusion and suicidal ideas.    Objective:    BP 133/85   Pulse 83   Ht 5\' 4"  (1.626 m)   Wt 257 lb (116.6 kg)   BMI 44.11 kg/m   Wt Readings from Last 3 Encounters:  12/30/17 257 lb (116.6 kg)  09/26/17 254 lb (115.2 kg)  06/05/17 254 lb (115.2 kg)    Physical Exam  Constitutional: She is oriented to person, place, and time. She appears well-developed.  HENT:  Head: Normocephalic and atraumatic.  Eyes: EOM are normal.  Neck: Normal range of motion. Neck supple. No tracheal deviation present. No thyromegaly present.  Cardiovascular: Normal rate and regular rhythm.  Pulmonary/Chest: Effort normal and breath sounds normal.  Abdominal: Soft. Bowel sounds are normal. There is no tenderness. There is no guarding.  Musculoskeletal: Normal range of motion. She exhibits no edema.  Neurological: She is alert and oriented to person, place, and time. She has normal reflexes. No cranial nerve deficit. Coordination normal.  Skin: Skin is warm and dry. No rash noted. No erythema. No pallor.  Psychiatric: She has a normal mood and affect. Judgment normal.    CMP     Component Value Date/Time   NA 139 12/28/2017 0914   K 3.7 12/28/2017 0914   CL 104 12/28/2017 0914   CO2 27 12/28/2017 0914   GLUCOSE 98 12/28/2017 0914   BUN 15 12/28/2017 0914   CREATININE 1.01 12/28/2017 0914   CALCIUM 9.3 12/28/2017 0914   PROT 7.2 12/28/2017 0914   ALBUMIN 3.8 06/01/2017 0851   AST  15 12/28/2017 0914   ALT 19 12/28/2017 0914   ALKPHOS 83 06/01/2017 0851   BILITOT 0.8 12/28/2017 0914   GFRNONAA 65 12/28/2017 0914   GFRAA 76 12/28/2017 0914     Diabetic Labs (most recent): Lab Results  Component Value Date   HGBA1C 6.8 (H) 12/28/2017   HGBA1C 6.4 (H) 09/21/2017   HGBA1C 6.8 (H) 06/01/2017     Lipid Panel ( most recent) Lipid Panel     Component Value Date/Time   CHOL 142 02/23/2017 0852   TRIG 66 02/23/2017 0852   HDL 36 (L) 02/23/2017 0852   CHOLHDL 3.9 02/23/2017 0852   VLDL 13 02/23/2017 0852   LDLCALC 93 02/23/2017 0852     Assessment & Plan:   1. Uncontrolled type 2 diabetes mellitus with complication, without long-term current use of insulin ([place])  - Patient has currently uncontrolled symptomatic type 2 DM since  52 years of age. - Her A1c  Is stable at 6.8%, generally improving from  >14 %. Recent labs reviewed.   - She does not report gross complications, however, patient remains at a high risk for more acute and chronic complications of diabetes which include CAD, CVA, CKD, retinopathy, and neuropathy. These are all discussed in detail  with the patient.  - I have counseled the patient on diet management and weight loss, by adopting a carbohydrate restricted/protein rich diet.  -  Suggestion is made for her to avoid simple carbohydrates  from her diet including Cakes, Sweet Desserts / Pastries, Ice Cream, Soda (diet and regular), Sweet Tea, Candies, Chips, Cookies, Store Bought Juices, Alcohol in Excess of  1-2 drinks a day, Artificial Sweeteners, and "Sugar-free" Products. This will help patient to have stable blood glucose profile and potentially avoid unintended weight gain.  - I encouraged the patient to switch to  unprocessed or minimally processed complex starch and increased protein intake (animal or plant source), fruits, and vegetables.  - Patient is advised to stick to a routine mealtimes to eat 3 meals  a day and avoid unnecessary  snacks ( to snack only to correct hypoglycemia).   - I have approached patient with the following individualized plan to manage diabetes and patient agrees:   - Based on her current glycemic response, she will not require prandial insulin for now.   - She is engaged to monitor properly. - I will continue Toujeo (will be replaced with [place])   20 units daily at bedtime associated with strict monitoring of glucose daily before breakfast and as needed.  -Patient is encouraged to call clinic for blood glucose levels less than 70 or above 200 mg /dl. - I will continue  metformin 1000 mg by mouth twice a day, therapeutically suitable for patient. - Patient will be considered for incretin therapy as appropriate next visit. - Patient specific target  A1c;  LDL, HDL, Triglycerides, and  Waist Circumference were discussed in detail.  2) BP/HTN: Her blood pressure is controlled to target . Continue current medications including ARB. 3) Lipids/HPL:   Her LDL is controlled at 93, she is not on statin medications.  4)  Weight/Diet:  No success in weight loss, CDE Consult in progress , exercise, and detailed carbohydrates information provided.  5) Chronic Care/Health Maintenance:  -Patient is on ARB and  encouraged to continue to follow up with Ophthalmology, Podiatrist at least yearly or according to recommendations, and advised to   stay away from smoking. I have recommended yearly flu vaccine and pneumonia vaccination at least every 5 years; moderate intensity exercise for up to 150 minutes weekly; and  sleep for at least 7 hours a day.  - I advised patient to maintain close follow up with her PCP for primary care needs. - Time spent with the patient: 25 min, of which >50% was spent in reviewing her blood glucose logs , discussing her hypo- and hyper-glycemic episodes, reviewing her current and  previous labs and insulin doses and developing a plan to avoid hypo- and hyper-glycemia. Please refer to  Patient Instructions for Blood Glucose Monitoring and Insulin/Medications Dosing Guide"  in media tab for additional information.   Follow up plan: - Return in about 4 months (around 04/29/2018) for meter, and logs.  Glade Lloyd, MD Phone: [phone]  Fax: [phone]  -  This note was partially dictated with voice recognition software. Similar sounding words can be transcribed inadequately or may not  be corrected upon review.  12/30/2017, 4:45 PM

## 2018-02-09 ENCOUNTER — Other Ambulatory Visit: Payer: Self-pay | Admitting: "Endocrinology

## 2018-03-20 DIAGNOSIS — Z6841 Body Mass Index (BMI) 40.0 and over, adult: Secondary | ICD-10-CM | POA: Diagnosis not present

## 2018-03-20 DIAGNOSIS — Z0001 Encounter for general adult medical examination with abnormal findings: Secondary | ICD-10-CM | POA: Diagnosis not present

## 2018-04-02 ENCOUNTER — Other Ambulatory Visit (HOSPITAL_COMMUNITY): Payer: Self-pay | Admitting: Family Medicine

## 2018-04-02 DIAGNOSIS — Z1231 Encounter for screening mammogram for malignant neoplasm of breast: Secondary | ICD-10-CM

## 2018-04-07 ENCOUNTER — Ambulatory Visit (HOSPITAL_COMMUNITY)
Admission: RE | Admit: 2018-04-07 | Discharge: 2018-04-07 | Disposition: A | Discharge status: Home / Self Care | Payer: 59 | Source: Ambulatory Visit | Attending: Family Medicine | Admitting: Family Medicine

## 2018-04-07 ENCOUNTER — Encounter (HOSPITAL_COMMUNITY): Payer: Self-pay | Admitting: Radiology

## 2018-04-07 DIAGNOSIS — Z1231 Encounter for screening mammogram for malignant neoplasm of breast: Secondary | ICD-10-CM | POA: Insufficient documentation

## 2018-04-22 ENCOUNTER — Encounter: Payer: Self-pay | Admitting: Gastroenterology

## 2018-04-26 ENCOUNTER — Other Ambulatory Visit: Payer: Self-pay | Admitting: "Endocrinology

## 2018-04-26 DIAGNOSIS — E1165 Type 2 diabetes mellitus with hyperglycemia: Secondary | ICD-10-CM | POA: Diagnosis not present

## 2018-04-26 DIAGNOSIS — E118 Type 2 diabetes mellitus with unspecified complications: Secondary | ICD-10-CM | POA: Diagnosis not present

## 2018-04-27 LAB — COMPREHENSIVE METABOLIC PANEL
ALK PHOS: 112 IU/L (ref 39–117)
ALT: 19 IU/L (ref 0–32)
AST: 16 IU/L (ref 0–40)
Albumin/Globulin Ratio: 1.4 (ref 1.2–2.2)
Albumin: 4.5 g/dL (ref 3.5–5.5)
BUN/Creatinine Ratio: 14 (ref 9–23)
BUN: 17 mg/dL (ref 6–24)
Bilirubin Total: 0.8 mg/dL (ref 0.0–1.2)
CO2: 22 mmol/L (ref 20–29)
Calcium: 9.9 mg/dL (ref 8.7–10.2)
Chloride: 99 mmol/L (ref 96–106)
Creatinine, Ser: 1.19 mg/dL — ABNORMAL HIGH (ref 0.57–1.00)
GFR calc Af Amer: 62 mL/min/{1.73_m2} (ref >59)
GFR calc non Af Amer: 54 mL/min/{1.73_m2} — ABNORMAL LOW (ref >59)
GLOBULIN, TOTAL: 3.2 g/dL (ref 1.5–4.5)
GLUCOSE: 169 mg/dL — AB (ref 65–99)
Potassium: 4.3 mmol/L (ref 3.5–5.2)
SODIUM: 138 mmol/L (ref 134–144)
Total Protein: 7.7 g/dL (ref 6.0–8.5)

## 2018-04-27 LAB — HGB A1C W/O EAG: HEMOGLOBIN A1C: 7.3 % — AB (ref 4.8–5.6)

## 2018-04-27 LAB — SPECIMEN STATUS REPORT

## 2018-04-29 ENCOUNTER — Ambulatory Visit: Payer: 59 | Admitting: "Endocrinology

## 2018-04-29 ENCOUNTER — Encounter: Payer: Self-pay | Admitting: "Endocrinology

## 2018-04-29 VITALS — BP 131/86 | HR 90 | Ht 64.0 in | Wt 263.0 lb

## 2018-04-29 DIAGNOSIS — I10 Essential (primary) hypertension: Secondary | ICD-10-CM

## 2018-04-29 DIAGNOSIS — E1165 Type 2 diabetes mellitus with hyperglycemia: Secondary | ICD-10-CM

## 2018-04-29 DIAGNOSIS — IMO0002 Reserved for concepts with insufficient information to code with codable children: Secondary | ICD-10-CM

## 2018-04-29 DIAGNOSIS — E118 Type 2 diabetes mellitus with unspecified complications: Secondary | ICD-10-CM | POA: Diagnosis not present

## 2018-04-29 NOTE — Progress Notes (Signed)
Subjective:    Patient ID: Jillian Adkins, female    DOB: [birth year]. Patient is being seen in f/u for management of diabetes requested by  Scherrie Bateman  Past Medical History:  Diagnosis Date  . Diabetes mellitus without complication ([place])   . Hypertension    Past Surgical History:  Procedure Laterality Date  . CESAREAN SECTION  [phone]  . TUBAL LIGATION     Social History   Socioeconomic History  . Marital status: Married    Spouse name: Not on file  . Number of children: Not on file  . Years of education: Not on file  . Highest education level: Not on file  Occupational History  . Not on file  Social Needs  . Financial resource strain: Not on file  . Food insecurity:    Worry: Not on file    Inability: Not on file  . Transportation needs:    Medical: Not on file    Non-medical: Not on file  Tobacco Use  . Smoking status: Never Smoker  . Smokeless tobacco: Never Used  Substance and Sexual Activity  . Alcohol use: No  . Drug use: No  . Sexual activity: Yes    Birth control/protection: Surgical    Comment: tubal  Lifestyle  . Physical activity:    Days per week: Not on file    Minutes per session: Not on file  . Stress: Not on file  Relationships  . Social connections:    Talks on phone: Not on file    Gets together: Not on file    Attends religious service: Not on file    Active member of club or organization: Not on file    Attends meetings of clubs or organizations: Not on file    Relationship status: Not on file  Other Topics Concern  . Not on file  Social History Narrative  . Not on file   Outpatient Encounter Medications as of 04/29/2018  Medication Sig  . insulin degludec (TRESIBA) 100 UNIT/ML SOPN FlexTouch Pen Inject 20 Units into the skin daily at 10 pm.  . amLODipine (NORVASC) 10 MG tablet Take 10 mg by mouth daily.  . Insulin Pen Needle (B-D ULTRAFINE III SHORT PEN) 31G X 8 MM MISC 1 each by Does not apply route as  directed.  . metFORMIN (GLUCOPHAGE) 1000 MG tablet Take 1,000 mg by mouth 2 (two) times daily with a meal.  . olmesartan-hydrochlorothiazide (BENICAR HCT) 40-25 MG tablet Take 1 tablet by mouth daily.  Marland Kitchen spironolactone (ALDACTONE) 50 MG tablet Take 50 mg by mouth daily.  . [DISCONTINUED] Insulin Degludec (TRESIBA FLEXTOUCH) 200 UNIT/ML SOPN Inject 20 Units into the skin at bedtime.  . [DISCONTINUED] TOUJEO SOLOSTAR 300 UNIT/ML SOPN INJECT 20 UNITS INTO THE SKIN AT BEDTIME.   No facility-administered encounter medications on file as of 04/29/2018.    ALLERGIES: No Known Allergies VACCINATION STATUS: Immunization History  Administered Date(s) Administered  . Influenza Whole 09/09/2007, 08/11/2008    Diabetes  She presents for her follow-up diabetic visit. She has type 2 diabetes mellitus. Onset time: She was diagnosed at approximate age of 52 years. Her disease course has been worsening. There are no hypoglycemic associated symptoms. Pertinent negatives for hypoglycemia include no confusion, headaches, pallor or seizures. There are no diabetic associated symptoms. Pertinent negatives for diabetes include no blurred vision, no chest pain, no fatigue, no polydipsia, no polyphagia and no polyuria. There are no hypoglycemic complications. Symptoms are worsening. There  are no diabetic complications. Risk factors for coronary artery disease include diabetes mellitus, hypertension, obesity, sedentary lifestyle and family history. Her weight is increasing steadily. She is following a generally unhealthy diet. When asked about meal planning, she reported none. She has not had a previous visit with a dietitian. She rarely participates in exercise. Her breakfast blood glucose range is generally 140-180 mg/dl. Her bedtime blood glucose range is generally 140-180 mg/dl. Her overall blood glucose range is 140-180 mg/dl. An ACE inhibitor/angiotensin II receptor blocker is being taken. Eye exam is current.   Hypertension  This is a chronic problem. The current episode started more than 1 year ago. The problem is controlled. Pertinent negatives include no blurred vision, chest pain, headaches, palpitations or shortness of breath. Risk factors for coronary artery disease include dyslipidemia, diabetes mellitus, obesity and sedentary lifestyle. Past treatments include angiotensin blockers.     Review of Systems  Constitutional: Negative for chills, fatigue, fever and unexpected weight change.  HENT: Negative for trouble swallowing and voice change.   Eyes: Negative for blurred vision and visual disturbance.  Respiratory: Negative for cough, shortness of breath and wheezing.   Cardiovascular: Negative for chest pain, palpitations and leg swelling.  Gastrointestinal: Negative for diarrhea, nausea and vomiting.  Endocrine: Negative for cold intolerance, heat intolerance, polydipsia, polyphagia and polyuria.  Musculoskeletal: Negative for arthralgias and myalgias.  Skin: Negative for color change, pallor, rash and wound.  Neurological: Negative for seizures and headaches.  Psychiatric/Behavioral: Negative for confusion and suicidal ideas.    Objective:    BP 131/86   Pulse 90   Ht 5\' 4"  (1.626 m)   Wt 263 lb (119.3 kg)   LMP 10/23/2015   BMI 45.14 kg/m   Wt Readings from Last 3 Encounters:  04/29/18 263 lb (119.3 kg)  12/30/17 257 lb (116.6 kg)  09/26/17 254 lb (115.2 kg)    Physical Exam  Constitutional: She is oriented to person, place, and time. She appears well-developed.  HENT:  Head: Normocephalic and atraumatic.  Eyes: EOM are normal.  Neck: Normal range of motion. Neck supple. No tracheal deviation present. No thyromegaly present.  Cardiovascular: Normal rate.  Pulmonary/Chest: Effort normal.  Abdominal: There is no tenderness. There is no guarding.  Musculoskeletal: Normal range of motion. She exhibits no edema.  Neurological: She is alert and oriented to person, place, and  time. No cranial nerve deficit. Coordination normal.  Skin: Skin is warm and dry. No rash noted. No erythema. No pallor.  Psychiatric: She has a normal mood and affect. Judgment normal.    CMP     Component Value Date/Time   NA 138 04/26/2018 1014   K 4.3 04/26/2018 1014   CL 99 04/26/2018 1014   CO2 22 04/26/2018 1014   GLUCOSE 169 (H) 04/26/2018 1014   GLUCOSE 98 12/28/2017 0914   BUN 17 04/26/2018 1014   CREATININE 1.19 (H) 04/26/2018 1014   CREATININE 1.01 12/28/2017 0914   CALCIUM 9.9 04/26/2018 1014   PROT 7.7 04/26/2018 1014   ALBUMIN 4.5 04/26/2018 1014   AST 16 04/26/2018 1014   ALT 19 04/26/2018 1014   ALKPHOS 112 04/26/2018 1014   BILITOT 0.8 04/26/2018 1014   GFRNONAA 54 (L) 04/26/2018 1014   GFRNONAA 65 12/28/2017 0914   GFRAA 62 04/26/2018 1014   GFRAA 76 12/28/2017 0914     Diabetic Labs (most recent): Lab Results  Component Value Date   HGBA1C 7.3 (H) 04/26/2018   HGBA1C 6.8 (H) 12/28/2017  HGBA1C 6.4 (H) 09/21/2017     Lipid Panel ( most recent) Lipid Panel     Component Value Date/Time   CHOL 142 02/23/2017 0852   TRIG 66 02/23/2017 0852   HDL 36 (L) 02/23/2017 0852   CHOLHDL 3.9 02/23/2017 0852   VLDL 13 02/23/2017 0852   LDLCALC 93 02/23/2017 0852     Assessment & Plan:   1. Uncontrolled type 2 diabetes mellitus with complication, without long-term current use of insulin ([place])  - Patient has currently uncontrolled symptomatic type 2 DM since  52 years of age. - Her A1c is increasing to 7.3% from 6.8%, after generally improving from   >14 %. Recent labs reviewed.   - She does not report gross complications, however, patient remains at a high risk for more acute and chronic complications of diabetes which include CAD, CVA, CKD, retinopathy, and neuropathy. These are all discussed in detail with the patient.  - I have counseled the patient on diet management and weight loss, by adopting a carbohydrate restricted/protein rich diet.  -   Suggestion is made for her to avoid simple carbohydrates  from her diet including Cakes, Sweet Desserts / Pastries, Ice Cream, Soda (diet and regular), Sweet Tea, Candies, Chips, Cookies, Store Bought Juices, Alcohol in Excess of  1-2 drinks a day, Artificial Sweeteners, and "Sugar-free" Products. This will help patient to have stable blood glucose profile and potentially avoid unintended weight gain.   - I encouraged the patient to switch to  unprocessed or minimally processed complex starch and increased protein intake (animal or plant source), fruits, and vegetables.  - Patient is advised to stick to a routine mealtimes to eat 3 meals  a day and avoid unnecessary snacks ( to snack only to correct hypoglycemia).   - I have approached patient with the following individualized plan to manage diabetes and patient agrees:   - Based on her current glycemic response, she will not require prandial insulin for now.   - She is engaged to monitor properly. - I advised her to continue Tresiba 20 units nightly, associated with strict monitoring of glucose daily before breakfast and as needed.  -Patient is encouraged to call clinic for blood glucose levels less than 70 or above 200 mg /dl. - I will continue  metformin 1000 mg by mouth twice a day, therapeutically suitable for patient. - Patient will be considered for incretin therapy as appropriate next visit. - Patient specific target  A1c;  LDL, HDL, Triglycerides, and  Waist Circumference were discussed in detail.  2) BP/HTN: Her blood pressure is controlled to target.   -She is advised to continue her current medications including Benicar, spironolactone, amlodipine.  3) Lipids/HPL:   Her LDL is controlled at 93, she is not on statin medications.  She will be considered on subsequent visits. 4)  Weight/Diet:  No success in weight loss, CDE Consult in progress , exercise, and detailed carbohydrates information provided.  5) Chronic Care/Health  Maintenance:  -Patient is on ARB and  encouraged to continue to follow up with Ophthalmology, Podiatrist at least yearly or according to recommendations, and advised to   stay away from smoking. I have recommended yearly flu vaccine and pneumonia vaccination at least every 5 years; moderate intensity exercise for up to 150 minutes weekly; and  sleep for at least 7 hours a day.  - I advised patient to maintain close follow up with her PCP for primary care needs.  - Time spent with the patient:  25 min, of which >50% was spent in reviewing her blood glucose logs , discussing her hypo- and hyper-glycemic episodes, reviewing her current and  previous labs and insulin doses and developing a plan to avoid hypo- and hyper-glycemia. Please refer to Patient Instructions for Blood Glucose Monitoring and Insulin/Medications Dosing Guide"  in media tab for additional information. Jillian Adkins participated in the discussions, expressed understanding, and voiced agreement with the above plans.  All questions were answered to her satisfaction. she is encouraged to contact clinic should she have any questions or concerns prior to her return visit.  Follow up plan: - Return in about 4 months (around 08/29/2018) for follow up with pre-visit labs, meter, and logs.  Glade Lloyd, MD Phone: [phone]  Fax: [phone]  -  This note was partially dictated with voice recognition software. Similar sounding words can be transcribed inadequately or may not  be corrected upon review.  04/29/2018, 4:37 PM

## 2018-04-29 NOTE — Patient Instructions (Signed)

## 2018-05-19 ENCOUNTER — Ambulatory Visit (INDEPENDENT_AMBULATORY_CARE_PROVIDER_SITE_OTHER): Payer: Self-pay

## 2018-05-19 DIAGNOSIS — Z1211 Encounter for screening for malignant neoplasm of colon: Secondary | ICD-10-CM

## 2018-05-19 MED ORDER — NA SULFATE-K SULFATE-MG SULF 17.5-3.13-1.6 GM/177ML PO SOLN: 1.0000 | ORAL | 0 refills | Status: DC

## 2018-05-19 NOTE — Patient Instructions (Signed)
Jillian Adkins  May 14, 1966 MRN: [id number]     Procedure Date: 08/08/18 Time to register: 10:30am Place to register: [facility] Procedure Time: 11:30am Scheduled provider: R. Garfield Cornea, MD    PREPARATION FOR COLONOSCOPY WITH SUPREP BOWEL PREP KIT  Note: Suprep Bowel Prep Kit is a split-dose (2day) regimen. Consumption of BOTH 6-ounce bottles is required for a complete prep.  Please notify us immediately if you are diabetic, take iron supplements, or if you are on Coumadin or any other blood thinners.  Please hold the following medications: I will mail you a letter with this information.                                                                                                                                                   2 DAYS BEFORE PROCEDURE:  DATE: 08/06/18   DAY: Wednesday Begin clear liquid diet AFTER your lunch meal. NO SOLID FOODS after this point.  1 DAY BEFORE PROCEDURE:  DATE: 08/07/18  DAY: Thursday Continue clear liquids the entire day - NO SOLID FOOD.   Diabetic medications adjustments for today: see letter  At 6:00pm: Complete steps 1 through 4 below, using ONE (1) 6-ounce bottle, before going to bed. Step 1:  Pour ONE (1) 6-ounce bottle of SUPREP liquid into the mixing container.  Step 2:  Add cool drinking water to the 16 ounce line on the container and mix.  Note: Dilute the solution concentrate as directed prior to use. Step 3:  DRINK ALL the liquid in the container. Step 4:  You MUST drink an additional two (2) or more 16 ounce containers of water  over the next one (1) hour.   Continue clear liquids only, until midnight. Do not eat or drink anything after midnight. EXCEPTION: If you take medications for your heart, blood pressure, or breathing, you may take these medications with a small amount of clear liquid.   DAY OF PROCEDURE:   DATE: 08/08/18  DAY: Friday  Diabetic medications adjustments for today:see letter  5 hours before  your procedure at 6:30am: Step 1:  Pour ONE (1) 6-ounce bottle of SUPREP liquid into the mixing container.  Step 2:  Add cool drinking water to the 16 ounce line on the container and mix.  Note: Dilute the solution concentrate as directed prior to use. Step 3:  DRINK ALL the liquid in the container. Step 4:  You MUST drink an additional two (2) or more 16 ounce containers of water over the next one (1) hour. You MUST complete the final glass of water at least 3 hours before your colonoscopy.   Nothing by mouth past 8:30am  You may take your morning medications with sip of water unless we have instructed otherwise.    Please see below for Dietary Information.  CLEAR LIQUIDS INCLUDE:  Water  Jello (NOT red in color)   Ice Popsicles (NOT red in color)   Tea (sugar ok, no milk/cream) Powdered fruit flavored drinks  Coffee (sugar ok, no milk/cream) Gatorade/ Lemonade/ Kool-Aid  (NOT red in color)   Juice: apple, white grape, white cranberry Soft drinks  Clear bullion, consomme, broth (fat free beef/chicken/vegetable)  Carbonated beverages (any kind)  Strained chicken noodle soup Hard Candy   Remember: Clear liquids are liquids that will allow you to see your fingers on the other side of a clear glass. Be sure liquids are NOT red in color, and not cloudy, but CLEAR.  DO NOT EAT OR DRINK ANY OF THE FOLLOWING:  Dairy products of any kind   Cranberry juice Tomato juice / V8 juice   Grapefruit juice Orange juice     Red grape juice  Do not eat any solid foods, including such foods as: cereal, oatmeal, yogurt, fruits, vegetables, creamed soups, eggs, bread, crackers, pureed foods in a blender, etc.   HELPFUL HINTS FOR DRINKING PREP SOLUTION:   Make sure prep is extremely cold. Mix and refrigerate the the morning of the prep. You may also put in the freezer.   You may try mixing some Crystal Light or Country Time Lemonade if you prefer. Mix in small amounts; add more if necessary.  Try  drinking through a straw  Rinse mouth with water or a mouthwash between glasses, to remove after-taste.  Try sipping on a cold beverage /ice/ popsicles between glasses of prep.  Place a piece of sugar-free hard candy in mouth between glasses.  If you become nauseated, try consuming smaller amounts, or stretch out the time between glasses. Stop for 30-60 minutes, then slowly start back drinking.     OTHER INSTRUCTIONS  You will need a responsible adult at least 50 years of age to accompany you and drive you home. This person must remain in the waiting room during your procedure. The hospital will cancel your procedure if you do not have a responsible adult with you.   1. Wear loose fitting clothing that is easily removed. 2. Leave jewelry and other valuables at home.  3. Remove all body piercing jewelry and leave at home. 4. Total time from sign-in until discharge is approximately 2-3 hours. 5. You should go home directly after your procedure and rest. You can resume normal activities the day after your procedure. 6. The day of your procedure you should not:  Drive  Make legal decisions  Operate machinery  Drink alcohol  Return to work   You may call the office (Dept: [phone]) before 5:00pm, or page the doctor on call [phone]) after 5:00pm, for further instructions, if necessary.   Insurance Information YOU WILL NEED TO CHECK WITH YOUR INSURANCE COMPANY FOR THE BENEFITS OF COVERAGE YOU HAVE FOR THIS PROCEDURE.  UNFORTUNATELY, NOT ALL INSURANCE COMPANIES HAVE BENEFITS TO COVER ALL OR PART OF THESE TYPES OF PROCEDURES.  IT IS YOUR RESPONSIBILITY TO CHECK YOUR BENEFITS, HOWEVER, WE WILL BE GLAD TO ASSIST YOU WITH ANY CODES YOUR INSURANCE COMPANY MAY NEED.    PLEASE NOTE THAT MOST INSURANCE COMPANIES WILL NOT COVER A SCREENING COLONOSCOPY FOR PEOPLE UNDER THE AGE OF 50  IF YOU HAVE BCBS INSURANCE, YOU MAY HAVE BENEFITS FOR A SCREENING COLONOSCOPY BUT IF POLYPS ARE  FOUND THE DIAGNOSIS WILL CHANGE AND THEN YOU MAY HAVE A DEDUCTIBLE THAT WILL NEED TO BE MET. SO PLEASE MAKE SURE YOU CHECK YOUR BENEFITS FOR A SCREENING COLONOSCOPY AS WELL AS  A DIAGNOSTIC COLONOSCOPY.

## 2018-05-19 NOTE — Progress Notes (Signed)
Gastroenterology Pre-Procedure Review  Request Date:05/19/18 [facility]  PATIENT REVIEW QUESTIONS: The patient responded to the following health history questions as indicated:    1. Diabetes Melitis: yes (tresiba and metformin) 2. Joint replacements in the past 12 months: no 3. Major health problems in the past 3 months: no 4. Has an artificial valve or MVP: no 5. Has a defibrillator: no 6. Has been advised in past to take antibiotics in advance of a procedure like teeth cleaning: no 7. Family history of colon cancer: no  8. Alcohol Use: no 9. History of sleep apnea: no  10. History of coronary artery or other vascular stents placed within the last 12 months: no 11. History of any prior anesthesia complications: no    MEDICATIONS & ALLERGIES:    Patient reports the following regarding taking any blood thinners:   Plavix? no Aspirin? no Coumadin? no Brilinta? no Xarelto? no Eliquis? no Pradaxa? no Savaysa? no Effient? no  Patient confirms/reports the following medications:  Current Outpatient Medications  Medication Sig Dispense Refill  . amLODipine (NORVASC) 10 MG tablet Take 10 mg by mouth daily.  1  . insulin degludec (TRESIBA) 100 UNIT/ML SOPN FlexTouch Pen Inject 20 Units into the skin daily at 10 pm.    . metFORMIN (GLUCOPHAGE) 1000 MG tablet Take 1,000 mg by mouth 2 (two) times daily with a meal.    . spironolactone (ALDACTONE) 50 MG tablet Take 50 mg by mouth daily.    . Insulin Pen Needle (B-D ULTRAFINE III SHORT PEN) 31G X 8 MM MISC 1 each by Does not apply route as directed. 90 each 3   No current facility-administered medications for this visit.     Patient confirms/reports the following allergies:  No Known Allergies  No orders of the defined types were placed in this encounter.   AUTHORIZATION INFORMATION Primary Insurance: [facility],  [place] #: [id number] Pre-Cert / Josem Kaufmann required: yes Pre-Cert / Auth #:  [id number]   SCHEDULE INFORMATION: Procedure has been scheduled as follows:  Date: 08/08/18, Time: 11:30  Location: [facility] Dr.Rourk  This Gastroenterology Pre-Precedure Review Form is being routed to the following provider(s): Neil Crouch, PA

## 2018-05-21 NOTE — Progress Notes (Signed)
Ok to schedule.  Day before TCS: tresiba 10 units at 10pm, metformin 500mg  bid Am of TCS: hold tresiba and metformin

## 2018-05-28 NOTE — Progress Notes (Signed)
Letter mailed with DM medication instructions.

## 2018-08-08 ENCOUNTER — Other Ambulatory Visit: Payer: Self-pay

## 2018-08-08 ENCOUNTER — Encounter (HOSPITAL_COMMUNITY)
Admission: RE | Disposition: A | Discharge status: Home / Self Care | Payer: Self-pay | Source: Ambulatory Visit | Attending: Internal Medicine

## 2018-08-08 ENCOUNTER — Ambulatory Visit (HOSPITAL_COMMUNITY)
Admission: RE | Admit: 2018-08-08 | Discharge: 2018-08-08 | Disposition: A | Discharge status: Home / Self Care | Payer: 59 | Source: Ambulatory Visit | Attending: Internal Medicine | Admitting: Internal Medicine

## 2018-08-08 ENCOUNTER — Encounter (HOSPITAL_COMMUNITY): Payer: Self-pay | Admitting: *Deleted

## 2018-08-08 DIAGNOSIS — Z79899 Other long term (current) drug therapy: Secondary | ICD-10-CM | POA: Insufficient documentation

## 2018-08-08 DIAGNOSIS — Z1211 Encounter for screening for malignant neoplasm of colon: Secondary | ICD-10-CM

## 2018-08-08 DIAGNOSIS — D124 Benign neoplasm of descending colon: Secondary | ICD-10-CM

## 2018-08-08 HISTORY — PX: POLYPECTOMY: SHX5525

## 2018-08-08 HISTORY — PX: COLONOSCOPY: SHX5424

## 2018-08-08 LAB — GLUCOSE, CAPILLARY: Glucose-Capillary: 120 mg/dL — ABNORMAL HIGH (ref 70–99)

## 2018-08-08 SURGERY — COLONOSCOPY
Anesthesia: Moderate Sedation

## 2018-08-08 MED ORDER — MEPERIDINE HCL 50 MG/ML IJ SOLN
INTRAMUSCULAR | Status: AC
  Filled 2018-08-08: qty 1

## 2018-08-08 MED ORDER — ONDANSETRON HCL 4 MG/2ML IJ SOLN
INTRAMUSCULAR | Status: AC
  Filled 2018-08-08: qty 2

## 2018-08-08 MED ORDER — MIDAZOLAM HCL 5 MG/5ML IJ SOLN
INTRAMUSCULAR | Status: AC
  Filled 2018-08-08: qty 5

## 2018-08-08 MED ORDER — ONDANSETRON HCL 4 MG/2ML IJ SOLN
INTRAMUSCULAR | Status: DC | PRN
  Administered 2018-08-08: 4 mg via INTRAVENOUS

## 2018-08-08 MED ORDER — MEPERIDINE HCL 100 MG/ML IJ SOLN
INTRAMUSCULAR | Status: DC | PRN
  Administered 2018-08-08: 25 mg

## 2018-08-08 MED ORDER — MIDAZOLAM HCL 5 MG/5ML IJ SOLN
INTRAMUSCULAR | Status: DC | PRN
  Administered 2018-08-08 (×3): 1 mg via INTRAVENOUS
  Administered 2018-08-08: 2 mg via INTRAVENOUS

## 2018-08-08 MED ORDER — SODIUM CHLORIDE 0.9 % IV SOLN
INTRAVENOUS | Status: DC
  Administered 2018-08-08: 10:00:00 via INTRAVENOUS

## 2018-08-08 NOTE — Progress Notes (Signed)
Dr Gala Romney made aware of patient's elevated blood pressure & that patient only took one of two blood pressure medications this morning.  Dr Gala Romney advised that patient should get back on routine medications as soon as she gets home and follow up with PCP regarding blood pressure.

## 2018-08-08 NOTE — Discharge Instructions (Signed)
Colonoscopy Discharge Instructions  Read the instructions outlined below and refer to this sheet in the next few weeks. These discharge instructions provide you with general information on caring for yourself after you leave the hospital. Your doctor may also give you specific instructions. While your treatment has been planned according to the most current medical practices available, unavoidable complications occasionally occur. If you have any problems or questions after discharge, call Dr. Gala Romney at [phone].  After hours and weekends call the hospital and have the GI doctor on call paged; they will call you back. ACTIVITY  You may resume your regular activity, but move at a slower pace for the next 24 hours.   Take frequent rest periods for the next 24 hours.   Walking will help get rid of the air and reduce the bloated feeling in your belly (abdomen).   No driving for 24 hours (because of the medicine (anesthesia) used during the test).    Do not sign any important legal documents or operate any machinery for 24 hours (because of the anesthesia used during the test).  NUTRITION  Drink plenty of fluids.   You may resume your normal diet as instructed by your doctor.   Begin with a light meal and progress to your normal diet. Heavy or fried foods are harder to digest and may make you feel sick to your stomach (nauseated).   Avoid alcoholic beverages for 24 hours or as instructed.  MEDICATIONS  You may resume your normal medications unless your doctor tells you otherwise.  WHAT YOU CAN EXPECT TODAY  Some feelings of bloating in the abdomen.   Passage of more gas than usual.   Spotting of blood in your stool or on the toilet paper.  IF YOU HAD POLYPS REMOVED DURING THE COLONOSCOPY:  No aspirin products for 7 days or as instructed.   No alcohol for 7 days or as instructed.   Eat a soft diet for the next 24 hours.  FINDING OUT THE RESULTS OF YOUR TEST Not all test  results are available during your visit. If your test results are not back during the visit, make an appointment with your caregiver to find out the results. Do not assume everything is normal if you have not heard from your caregiver or the medical facility. It is important for you to follow up on all of your test results.  SEEK IMMEDIATE MEDICAL ATTENTION IF:  You have more than a spotting of blood in your stool.   Your belly is swollen (abdominal distention).   You are nauseated or vomiting.   You have a temperature over 101.   You have abdominal pain or discomfort that is severe or gets worse throughout the day.    Polyp information provided  Further recommendations to follow pending review of pathology report   Colon Polyps  Polyps are tissue growths inside the body. Polyps can grow in many places, including the large intestine (colon). A polyp may be a round bump or a mushroom-shaped growth. You could have one polyp or several. Most colon polyps are noncancerous (benign). However, some colon polyps can become cancerous over time. What are the causes? The exact cause of colon polyps is not known. What increases the risk? This condition is more likely to develop in people who:  Have a family history of colon cancer or colon polyps.  Are older than 70 or older than 45 if they are African American.  Have inflammatory bowel disease, such as  ulcerative colitis or Crohn disease.  Are overweight.  Smoke cigarettes.  Do not get enough exercise.  Drink too much alcohol.  Eat a diet that is: ? High in fat and red meat. ? Low in fiber.  Had childhood cancer that was treated with abdominal radiation.  What are the signs or symptoms? Most polyps do not cause symptoms. If you have symptoms, they may include:  Blood coming from your rectum when having a bowel movement.  Blood in your stool.The stool may look dark red or black.  A change in bowel habits, such as  constipation or diarrhea.  How is this diagnosed? This condition is diagnosed with a colonoscopy. This is a procedure that uses a lighted, flexible scope to look at the inside of your colon. How is this treated? Treatment for this condition involves removing any polyps that are found. Those polyps will then be tested for cancer. If cancer is found, your health care provider will talk to you about options for colon cancer treatment. Follow these instructions at home: Diet  Eat plenty of fiber, such as fruits, vegetables, and whole grains.  Eat foods that are high in calcium and vitamin D, such as milk, cheese, yogurt, eggs, liver, fish, and broccoli.  Limit foods high in fat, red meats, and processed meats, such as hot dogs, sausage, bacon, and lunch meats.  Maintain a healthy weight, or lose weight if recommended by your health care provider. General instructions  Do not smoke cigarettes.  Do not drink alcohol excessively.  Keep all follow-up visits as told by your health care provider. This is important. This includes keeping regularly scheduled colonoscopies. Talk to your health care provider about when you need a colonoscopy.  Exercise every day or as told by your health care provider. Contact a health care provider if:  You have new or worsening bleeding during a bowel movement.  You have new or increased blood in your stool.  You have a change in bowel habits.  You unexpectedly lose weight. This information is not intended to replace advice given to you by your health care provider. Make sure you discuss any questions you have with your health care provider. Document Released: 08/01/2004 Document Revised: 04/12/2016 Document Reviewed: 09/26/2015 Elsevier Interactive Patient Education  [employer].

## 2018-08-08 NOTE — Op Note (Signed)
[facility] Patient Name: Jillian Adkins Procedure Date: 08/08/2018 10:06 AM MRN: [id number] Date of Birth: 1966/07/29 Attending MD: Norvel Richards , MD CSN: [id number] Age: 52 Admit Type: Outpatient Procedure:                Colonoscopy Indications:              Screening for colorectal malignant neoplasm Providers:                Norvel Richards, MD, Janeece Riggers, RN, Nelma Rothman, Technician Referring MD:              Medicines:                Midazolam 5 mg IV, Meperidine 25 mg IV, Ondansetron                            4 mg IV Complications:            No immediate complications. Estimated Blood Loss:     Estimated blood loss was minimal. Procedure:                Pre-Anesthesia Assessment:                           - Prior to the procedure, a History and Physical                            was performed, and patient medications and                            allergies were reviewed. The patient's tolerance of                            previous anesthesia was also reviewed. The risks                            and benefits of the procedure and the sedation                            options and risks were discussed with the patient.                            All questions were answered, and informed consent                            was obtained. Prior Anticoagulants: The patient has                            taken no previous anticoagulant or antiplatelet                            agents. ASA Grade Assessment: II - A patient with  mild systemic disease. After reviewing the risks                            and benefits, the patient was deemed in                            satisfactory condition to undergo the procedure.                           After obtaining informed consent, the colonoscope                            was passed under direct vision. Throughout the                            procedure, the  patient's blood pressure, pulse, and                            oxygen saturations were monitored continuously. The                            CF-[id number] ([id number]) scope was introduced through                            the anus and advanced to the the cecum, identified                            by appendiceal orifice and ileocecal valve. The                            colonoscopy was performed without difficulty. The                            patient tolerated the procedure well. The quality                            of the bowel preparation was adequate. The                            ileocecal valve, appendiceal orifice, and rectum                            were photographed. The entire colon was well                            visualized. Scope In: 10:17:51 AM Scope Out: 10:30:54 AM Scope Withdrawal Time: 0 hours 9 minutes 46 seconds  Total Procedure Duration: 0 hours 13 minutes 3 seconds  Findings:      The perianal and digital rectal examinations were normal.      A 6 mm polyp was found in the descending colon. The polyp was sessile.       The polyp was removed with a cold snare. Resection and retrieval were       complete. Estimated blood loss was minimal.  The exam was otherwise without abnormality on direct and retroflexion       views. Impression:               - One 6 mm polyp in the descending colon, removed                            with a cold snare. Resected and retrieved.                           - The examination was otherwise normal on direct                            and retroflexion views. Moderate Sedation:      Moderate (conscious) sedation was administered by the endoscopy nurse       and supervised by the endoscopist. The following parameters were       monitored: oxygen saturation, heart rate, blood pressure, respiratory       rate, EKG, adequacy of pulmonary ventilation, and response to care.       Total physician intraservice time was 18  minutes. Recommendation:           - Patient has a contact number available for                            emergencies. The signs and symptoms of potential                            delayed complications were discussed with the                            patient. Return to normal activities tomorrow.                            Written discharge instructions were provided to the                            patient.                           - Resume previous diet.                           - Repeat colonoscopy date to be determined after                            pending pathology results are reviewed for                            surveillance.                           - Return to GI office (date not yet determined). Procedure Code(s):        --- Professional ---                           [phone], Colonoscopy, flexible; with removal of  tumor(s), polyp(s), or other lesion(s) by snare                            technique                           G0500, Moderate sedation services provided by the                            same physician or other qualified health care                            professional performing a gastrointestinal                            endoscopic service that sedation supports,                            requiring the presence of an independent trained                            observer to assist in the monitoring of the                            patient's level of consciousness and physiological                            status; initial 15 minutes of intra-service time;                            patient age 39 years or older (additional time may                            be reported with [phone], as appropriate) Diagnosis Code(s):        --- Professional ---                           Z12.11, Encounter for screening for malignant                            neoplasm of colon                           D12.4, Benign neoplasm of  descending colon CPT copyright 2017 American [employer] Association. All rights reserved. The codes documented in this report are preliminary and upon coder review may  be revised to meet current compliance requirements. Cristopher Estimable. Genetta Fiero, MD Norvel Richards, MD 08/08/2018 10:39:47 AM This report has been signed electronically. Number of Addenda: 0

## 2018-08-08 NOTE — H&P (Signed)
@LOGO @   Primary Care Physician:  Jake Samples, PA-C Primary Gastroenterologist:  Dr. Gala Romney  Pre-Procedure History & Physical: HPI:  Jillian Adkins is a 52 y.o. female is here for a screening colonoscopy. No bowel symptoms. No prior colonoscopy. No family history of colon cancer.  Past Medical History:  Diagnosis Date  . Diabetes mellitus without complication ([place])   . Hypertension     Past Surgical History:  Procedure Laterality Date  . CESAREAN SECTION  [phone]  . TUBAL LIGATION      Prior to Admission medications   Medication Sig Start Date End Date Taking? Authorizing Provider  amLODipine (NORVASC) 10 MG tablet Take 10 mg by mouth daily. 11/10/14  Yes [provider]  insulin degludec (TRESIBA) 100 UNIT/ML SOPN FlexTouch Pen Inject 20 Units into the skin daily at 10 pm.   Yes [provider]  metFORMIN (GLUCOPHAGE) 1000 MG tablet Take 1,000 mg by mouth 2 (two) times daily with a meal.   Yes [provider]  spironolactone (ALDACTONE) 50 MG tablet Take 50 mg by mouth daily.   Yes [provider]  Insulin Pen Needle (B-D ULTRAFINE III SHORT PEN) 31G X 8 MM MISC 1 each by Does not apply route as directed. 01/02/17   Cassandria Anger, MD    Allergies as of 05/19/2018  . (No Known Allergies)    Family History  Problem Relation Age of Onset  . Hypertension Mother   . Diabetes Mother   . Colon cancer Neg Hx     Social History   Socioeconomic History  . Marital status: Married    Spouse name: Not on file  . Number of children: Not on file  . Years of education: Not on file  . Highest education level: Not on file  Occupational History  . Not on file  Social Needs  . Financial resource strain: Not on file  . Food insecurity:    Worry: Not on file    Inability: Not on file  . Transportation needs:    Medical: Not on file    Non-medical: Not on file  Tobacco Use  . Smoking status: Never Smoker  . Smokeless  tobacco: Never Used  Substance and Sexual Activity  . Alcohol use: No  . Drug use: No  . Sexual activity: Yes    Birth control/protection: Surgical    Comment: tubal  Lifestyle  . Physical activity:    Days per week: Not on file    Minutes per session: Not on file  . Stress: Not on file  Relationships  . Social connections:    Talks on phone: Not on file    Gets together: Not on file    Attends religious service: Not on file    Active member of club or organization: Not on file    Attends meetings of clubs or organizations: Not on file    Relationship status: Not on file  . Intimate partner violence:    Fear of current or ex partner: Not on file    Emotionally abused: Not on file    Physically abused: Not on file    Forced sexual activity: Not on file  Other Topics Concern  . Not on file  Social History Narrative  . Not on file    Review of Systems: See HPI, otherwise negative ROS  Physical Exam: BP (!) 159/108   Pulse 93   Temp 97.6 F (36.4 C) (Oral)   Resp 16  Ht 5\' 4"  (1.626 m)   Wt 114.8 kg   LMP 10/23/2015   SpO2 100%   BMI 43.43 kg/m  General:   Alert,  Well-developed, well-nourished, pleasant and cooperative in NAD Lungs:  Clear throughout to auscultation.   No wheezes, crackles, or rhonchi. No acute distress. Heart:  Regular rate and rhythm; no murmurs, clicks, rubs,  or gallops. Abdomen:  Soft, nontender and nondistended. No masses, hepatosplenomegaly or hernias noted. Normal bowel sounds, without guarding, and without rebound.    Impression/Plan: Jillian Adkins is now here to undergo a screening colonoscopy.  Risks, benefits, limitations, imponderables and alternatives regarding colonoscopy have been reviewed with the patient. Questions have been answered. All parties agreeable.     Notice:  This dictation was prepared with Dragon dictation along with smaller phrase technology. Any transcriptional errors that result from this process are  unintentional and may not be corrected upon review.

## 2018-08-11 ENCOUNTER — Encounter: Payer: Self-pay | Admitting: Internal Medicine

## 2018-08-13 ENCOUNTER — Encounter (HOSPITAL_COMMUNITY): Payer: Self-pay | Admitting: Internal Medicine

## 2018-08-30 ENCOUNTER — Other Ambulatory Visit: Payer: Self-pay | Admitting: "Endocrinology

## 2018-08-30 DIAGNOSIS — E118 Type 2 diabetes mellitus with unspecified complications: Secondary | ICD-10-CM | POA: Diagnosis not present

## 2018-08-30 DIAGNOSIS — E1165 Type 2 diabetes mellitus with hyperglycemia: Secondary | ICD-10-CM | POA: Diagnosis not present

## 2018-08-31 LAB — COMPREHENSIVE METABOLIC PANEL
ALBUMIN: 4 g/dL (ref 3.5–5.5)
ALK PHOS: 106 IU/L (ref 39–117)
ALT: 16 IU/L (ref 0–32)
AST: 18 IU/L (ref 0–40)
Albumin/Globulin Ratio: 1.2 (ref 1.2–2.2)
BILIRUBIN TOTAL: 0.8 mg/dL (ref 0.0–1.2)
BUN / CREAT RATIO: 17 (ref 9–23)
BUN: 19 mg/dL (ref 6–24)
CHLORIDE: 101 mmol/L (ref 96–106)
CO2: 22 mmol/L (ref 20–29)
CREATININE: 1.1 mg/dL — AB (ref 0.57–1.00)
Calcium: 9.9 mg/dL (ref 8.7–10.2)
GFR calc non Af Amer: 59 mL/min/{1.73_m2} — ABNORMAL LOW (ref >59)
GFR, EST AFRICAN AMERICAN: 68 mL/min/{1.73_m2} (ref >59)
GLOBULIN, TOTAL: 3.4 g/dL (ref 1.5–4.5)
Glucose: 135 mg/dL — ABNORMAL HIGH (ref 65–99)
Potassium: 4.2 mmol/L (ref 3.5–5.2)
SODIUM: 138 mmol/L (ref 134–144)
TOTAL PROTEIN: 7.4 g/dL (ref 6.0–8.5)

## 2018-08-31 LAB — HGB A1C W/O EAG: Hgb A1c MFr Bld: 7.1 % — ABNORMAL HIGH (ref 4.8–5.6)

## 2018-08-31 LAB — SPECIMEN STATUS REPORT

## 2018-09-01 ENCOUNTER — Encounter: Payer: Self-pay | Admitting: "Endocrinology

## 2018-09-01 ENCOUNTER — Ambulatory Visit (INDEPENDENT_AMBULATORY_CARE_PROVIDER_SITE_OTHER): Payer: 59 | Admitting: "Endocrinology

## 2018-09-01 ENCOUNTER — Other Ambulatory Visit: Payer: Self-pay | Admitting: "Endocrinology

## 2018-09-01 VITALS — BP 141/82 | HR 85 | Ht 64.0 in | Wt 270.0 lb

## 2018-09-01 DIAGNOSIS — E1165 Type 2 diabetes mellitus with hyperglycemia: Secondary | ICD-10-CM | POA: Diagnosis not present

## 2018-09-01 DIAGNOSIS — I10 Essential (primary) hypertension: Secondary | ICD-10-CM | POA: Diagnosis not present

## 2018-09-01 DIAGNOSIS — E118 Type 2 diabetes mellitus with unspecified complications: Secondary | ICD-10-CM

## 2018-09-01 DIAGNOSIS — IMO0002 Reserved for concepts with insufficient information to code with codable children: Secondary | ICD-10-CM

## 2018-09-01 MED ORDER — HYDROCHLOROTHIAZIDE 25 MG PO TABS: 25.0000 mg | ORAL_TABLET | Freq: Every day | ORAL | 2 refills | Status: DC

## 2018-09-01 NOTE — Progress Notes (Signed)
Subjective:    Patient ID: Jillian Adkins, female    DOB: [birth year]. Patient is being seen in f/u for management of diabetes requested by  Scherrie Bateman  Past Medical History:  Diagnosis Date  . Diabetes mellitus without complication ([employer])   . Hypertension    Past Surgical History:  Procedure Laterality Date  . CESAREAN SECTION  [phone]  . COLONOSCOPY N/A 08/08/2018   Procedure: COLONOSCOPY;  Surgeon: Daneil Dolin, MD;  [facility];  Service: Endoscopy;  Laterality: N/A;  11:30  . POLYPECTOMY  08/08/2018   Procedure: POLYPECTOMY;  Surgeon: Daneil Dolin, MD;  Location: [facility];  Service: Endoscopy;;  . TUBAL LIGATION     Social History   Socioeconomic History  . Marital status: Married    Spouse name: Not on file  . Number of children: Not on file  . Years of education: Not on file  . Highest education level: Not on file  Occupational History  . Not on file  Social Needs  . Financial resource strain: Not on file  . Food insecurity:    Worry: Not on file    Inability: Not on file  . Transportation needs:    Medical: Not on file    Non-medical: Not on file  Tobacco Use  . Smoking status: Never Smoker  . Smokeless tobacco: Never Used  Substance and Sexual Activity  . Alcohol use: No  . Drug use: No  . Sexual activity: Yes    Birth control/protection: Surgical    Comment: tubal  Lifestyle  . Physical activity:    Days per week: Not on file    Minutes per session: Not on file  . Stress: Not on file  Relationships  . Social connections:    Talks on phone: Not on file    Gets together: Not on file    Attends religious service: Not on file    Active member of club or organization: Not on file    Attends meetings of clubs or organizations: Not on file    Relationship status: Not on file  Other Topics Concern  . Not on file  Social History Narrative  . Not on file   Outpatient Encounter Medications as of 09/01/2018   Medication Sig  . hydrochlorothiazide (HYDRODIURIL) 25 MG tablet Take 1 tablet (25 mg total) by mouth daily.  . insulin degludec (TRESIBA) 100 UNIT/ML SOPN FlexTouch Pen Inject 20 Units into the skin daily at 10 pm.  . Insulin Pen Needle (B-D ULTRAFINE III SHORT PEN) 31G X 8 MM MISC 1 each by Does not apply route as directed.  . metFORMIN (GLUCOPHAGE) 1000 MG tablet Take 1,000 mg by mouth 2 (two) times daily with a meal.  . spironolactone (ALDACTONE) 50 MG tablet Take 50 mg by mouth daily.  . [DISCONTINUED] amLODipine (NORVASC) 10 MG tablet Take 10 mg by mouth daily.   No facility-administered encounter medications on file as of 09/01/2018.    ALLERGIES: No Known Allergies VACCINATION STATUS: Immunization History  Administered Date(s) Administered  . Influenza Whole 09/09/2007, 08/11/2008    Diabetes  She presents for her follow-up diabetic visit. She has type 2 diabetes mellitus. Onset time: She was diagnosed at approximate age of 52 years. Her disease course has been improving. There are no hypoglycemic associated symptoms. Pertinent negatives for hypoglycemia include no confusion, headaches, pallor or seizures. There are no diabetic associated symptoms. Pertinent negatives for diabetes include no blurred vision, no  chest pain, no fatigue, no polydipsia, no polyphagia and no polyuria. There are no hypoglycemic complications. Symptoms are improving. There are no diabetic complications. Risk factors for coronary artery disease include diabetes mellitus, hypertension, obesity, sedentary lifestyle and family history. She is compliant with treatment most of the time. Her weight is increasing steadily. She is following a generally unhealthy diet. When asked about meal planning, she reported none. She has not had a previous visit with a dietitian. She rarely participates in exercise. Her breakfast blood glucose range is generally 140-180 mg/dl. Her bedtime blood glucose range is generally 140-180  mg/dl. Her overall blood glucose range is 140-180 mg/dl. An ACE inhibitor/angiotensin II receptor blocker is being taken. Eye exam is current.  Hypertension  This is a chronic problem. The current episode started more than 1 year ago. The problem is controlled. Pertinent negatives include no blurred vision, chest pain, headaches, palpitations or shortness of breath. Risk factors for coronary artery disease include dyslipidemia, diabetes mellitus, obesity and sedentary lifestyle. Past treatments include angiotensin blockers.     Review of Systems  Constitutional: Negative for chills, fatigue, fever and unexpected weight change.  HENT: Negative for trouble swallowing and voice change.   Eyes: Negative for blurred vision and visual disturbance.  Respiratory: Negative for cough, shortness of breath and wheezing.   Cardiovascular: Negative for chest pain, palpitations and leg swelling.  Gastrointestinal: Negative for diarrhea, nausea and vomiting.  Endocrine: Negative for cold intolerance, heat intolerance, polydipsia, polyphagia and polyuria.  Musculoskeletal: Negative for arthralgias and myalgias.  Skin: Negative for color change, pallor, rash and wound.  Neurological: Negative for seizures and headaches.  Psychiatric/Behavioral: Negative for confusion and suicidal ideas.    Objective:    BP (!) 141/82   Pulse 85   Ht 5\' 4"  (1.626 m)   Wt 270 lb (122.5 kg)   LMP 10/23/2015   BMI 46.35 kg/m   Wt Readings from Last 3 Encounters:  09/01/18 270 lb (122.5 kg)  08/08/18 253 lb (114.8 kg)  04/29/18 263 lb (119.3 kg)    Physical Exam  Constitutional: She is oriented to person, place, and time. She appears well-developed.  HENT:  Head: Normocephalic and atraumatic.  Eyes: EOM are normal.  Neck: Normal range of motion. Neck supple. No tracheal deviation present. No thyromegaly present.  Cardiovascular: Normal rate.  Pulmonary/Chest: Effort normal.  Abdominal: There is no tenderness.  There is no guarding.  Musculoskeletal: Normal range of motion. She exhibits edema.  Neurological: She is alert and oriented to person, place, and time. No cranial nerve deficit. Coordination normal.  Skin: Skin is warm and dry. No rash noted. No erythema. No pallor.  Psychiatric: She has a normal mood and affect. Judgment normal.    CMP     Component Value Date/Time   NA 138 08/30/2018 0954   K 4.2 08/30/2018 0954   CL 101 08/30/2018 0954   CO2 22 08/30/2018 0954   GLUCOSE 135 (H) 08/30/2018 0954   GLUCOSE 98 12/28/2017 0914   BUN 19 08/30/2018 0954   CREATININE 1.10 (H) 08/30/2018 0954   CREATININE 1.01 12/28/2017 0914   CALCIUM 9.9 08/30/2018 0954   PROT 7.4 08/30/2018 0954   ALBUMIN 4.0 08/30/2018 0954   AST 18 08/30/2018 0954   ALT 16 08/30/2018 0954   ALKPHOS 106 08/30/2018 0954   BILITOT 0.8 08/30/2018 0954   GFRNONAA 59 (L) 08/30/2018 0954   GFRNONAA 65 12/28/2017 0914   GFRAA 68 08/30/2018 0954   GFRAA 76 12/28/2017  0347     Diabetic Labs (most recent): Lab Results  Component Value Date   HGBA1C 7.1 (H) 08/30/2018   HGBA1C 7.3 (H) 04/26/2018   HGBA1C 6.8 (H) 12/28/2017     Lipid Panel ( most recent) Lipid Panel     Component Value Date/Time   CHOL 142 02/23/2017 0852   TRIG 66 02/23/2017 0852   HDL 36 (L) 02/23/2017 0852   CHOLHDL 3.9 02/23/2017 0852   VLDL 13 02/23/2017 0852   LDLCALC 93 02/23/2017 0852     Assessment & Plan:   1. Uncontrolled type 2 diabetes mellitus with complication, without long-term current use of insulin ([place])  - Patient has currently uncontrolled symptomatic type 2 DM since  52 years of age. - Her A1c is 7%  generally improving from   >14 %. Recent labs reviewed.   - She does not report gross complications, however, patient remains at a high risk for more acute and chronic complications of diabetes which include CAD, CVA, CKD, retinopathy, and neuropathy. These are all discussed in detail with the patient.  - I have  counseled the patient on diet management and weight loss, by adopting a carbohydrate restricted/protein rich diet.  -She is still admits to dietary indiscretions.  -  Suggestion is made for her to avoid simple carbohydrates  from her diet including Cakes, Sweet Desserts / Pastries, Ice Cream, Soda (diet and regular), Sweet Tea, Candies, Chips, Cookies, Store Bought Juices, Alcohol in Excess of  1-2 drinks a day, Artificial Sweeteners, and "Sugar-free" Products. This will help patient to have stable blood glucose profile and potentially avoid unintended weight gain.   - I encouraged the patient to switch to  unprocessed or minimally processed complex starch and increased protein intake (animal or plant source), fruits, and vegetables.  - Patient is advised to stick to a routine mealtimes to eat 3 meals  a day and avoid unnecessary snacks ( to snack only to correct hypoglycemia).   - I have approached patient with the following individualized plan to manage diabetes and patient agrees:   -Patient presents with continued improvement in her glycemic profile, responding to her basal insulin.  -She is advised to continue Tresiba 20 units nightly, associated with strict monitoring of glucose daily before breakfast and as needed.  -Patient is encouraged to call clinic for blood glucose levels less than 70 or above 200 mg /dl. -She is advised to continue  metformin 1000 mg by mouth twice a day, therapeutically suitable for patient.  - Patient will be considered for incretin therapy as appropriate next visit. - Patient specific target  A1c;  LDL, HDL, Triglycerides, and  Waist Circumference were discussed in detail.  2) BP/HTN: Her blood pressure is not controlled to target.  Due to her presentation with ankle edema, I advised her to discontinue amlodipine.  I discussed and initiated hydrochlorothiazide 25 mg p.o. daily along with Spironolactone 50 mg p.o. Daily.    3) Lipids/HPL:   Her LDL is  controlled at 93, she is not on statin medications.  She will be considered on subsequent visits. 4)  Weight/Diet:  No success in weight loss, CDE Consult in progress , exercise, and detailed carbohydrates information provided.  5) Chronic Care/Health Maintenance:  -Patient is on ARB and  encouraged to continue to follow up with Ophthalmology, Podiatrist at least yearly or according to recommendations, and advised to   stay away from smoking. I have recommended yearly flu vaccine and pneumonia vaccination at least every  5 years; moderate intensity exercise for up to 150 minutes weekly; and  sleep for at least 7 hours a day.  - I advised patient to maintain close follow up with her PCP for primary care needs.  - Time spent with the patient: 25 min, of which >50% was spent in reviewing her blood glucose logs , discussing her hypo- and hyper-glycemic episodes, reviewing her current and  previous labs and insulin doses and developing a plan to avoid hypo- and hyper-glycemia. Please refer to Patient Instructions for Blood Glucose Monitoring and Insulin/Medications Dosing Guide"  in media tab for additional information. Jillian Adkins participated in the discussions, expressed understanding, and voiced agreement with the above plans.  All questions were answered to her satisfaction. she is encouraged to contact clinic should she have any questions or concerns prior to her return visit.   Follow up plan: - Return in about 4 months (around 01/02/2019) for Meter, and Logs, Follow up with Pre-visit Labs, Meter, and Logs.  Glade Lloyd, MD Phone: [phone]  Fax: [phone]  -  This note was partially dictated with voice recognition software. Similar sounding words can be transcribed inadequately or may not  be corrected upon review.  09/01/2018, 4:27 PM

## 2018-09-01 NOTE — Patient Instructions (Signed)

## 2018-11-13 DIAGNOSIS — E1165 Type 2 diabetes mellitus with hyperglycemia: Secondary | ICD-10-CM | POA: Diagnosis not present

## 2018-11-13 DIAGNOSIS — I10 Essential (primary) hypertension: Secondary | ICD-10-CM | POA: Diagnosis not present

## 2018-11-13 DIAGNOSIS — Z6841 Body Mass Index (BMI) 40.0 and over, adult: Secondary | ICD-10-CM | POA: Diagnosis not present

## 2019-01-03 ENCOUNTER — Other Ambulatory Visit: Payer: Self-pay | Admitting: "Endocrinology

## 2019-01-03 DIAGNOSIS — E1165 Type 2 diabetes mellitus with hyperglycemia: Secondary | ICD-10-CM | POA: Diagnosis not present

## 2019-01-03 DIAGNOSIS — E118 Type 2 diabetes mellitus with unspecified complications: Secondary | ICD-10-CM | POA: Diagnosis not present

## 2019-01-03 LAB — HEMOGLOBIN A1C: HEMOGLOBIN A1C: 7.4

## 2019-01-03 LAB — VITAMIN D 25 HYDROXY (VIT D DEFICIENCY, FRACTURES): VIT D 25 HYDROXY: 12.2

## 2019-01-03 LAB — BASIC METABOLIC PANEL
BUN: 19 (ref 4–21)
Creatinine: 1.2 — AB (ref 0.5–1.1)

## 2019-01-05 LAB — COMPREHENSIVE METABOLIC PANEL
A/G RATIO: 1.3 (ref 1.2–2.2)
ALBUMIN: 4.1 g/dL (ref 3.8–4.8)
ALT: 19 IU/L (ref 0–32)
AST: 12 IU/L (ref 0–40)
Alkaline Phosphatase: 110 IU/L (ref 39–117)
BILIRUBIN TOTAL: 0.8 mg/dL (ref 0.0–1.2)
BUN / CREAT RATIO: 15 (ref 9–23)
BUN: 19 mg/dL (ref 6–24)
CHLORIDE: 101 mmol/L (ref 96–106)
CO2: 24 mmol/L (ref 20–29)
Calcium: 9.8 mg/dL (ref 8.7–10.2)
Creatinine, Ser: 1.24 mg/dL — ABNORMAL HIGH (ref 0.57–1.00)
GFR calc non Af Amer: 51 mL/min/{1.73_m2} — ABNORMAL LOW (ref >59)
GFR, EST AFRICAN AMERICAN: 59 mL/min/{1.73_m2} — AB (ref >59)
GLOBULIN, TOTAL: 3.1 g/dL (ref 1.5–4.5)
GLUCOSE: 123 mg/dL — AB (ref 65–99)
POTASSIUM: 3.9 mmol/L (ref 3.5–5.2)
Sodium: 141 mmol/L (ref 134–144)
TOTAL PROTEIN: 7.2 g/dL (ref 6.0–8.5)

## 2019-01-05 LAB — MICROALBUMIN / CREATININE URINE RATIO
Creatinine, Urine: 272.2 mg/dL
Microalb/Creat Ratio: 37 mg/g creat — ABNORMAL HIGH (ref 0–29)
Microalbumin, Urine: 100.4 ug/mL

## 2019-01-05 LAB — HGB A1C W/O EAG: Hgb A1c MFr Bld: 7.4 % — ABNORMAL HIGH (ref 4.8–5.6)

## 2019-01-05 LAB — T4, FREE: Free T4: 1.52 ng/dL (ref 0.82–1.77)

## 2019-01-05 LAB — TSH: TSH: 0.929 u[IU]/mL (ref 0.450–4.500)

## 2019-01-05 LAB — VITAMIN D 25 HYDROXY (VIT D DEFICIENCY, FRACTURES): VIT D 25 HYDROXY: 12.2 ng/mL — AB (ref 30.0–100.0)

## 2019-01-06 ENCOUNTER — Encounter: Payer: Self-pay | Admitting: "Endocrinology

## 2019-01-06 ENCOUNTER — Ambulatory Visit (INDEPENDENT_AMBULATORY_CARE_PROVIDER_SITE_OTHER): Payer: 59 | Admitting: "Endocrinology

## 2019-01-06 VITALS — BP 152/93 | HR 86 | Ht 64.0 in | Wt 269.0 lb

## 2019-01-06 DIAGNOSIS — E118 Type 2 diabetes mellitus with unspecified complications: Secondary | ICD-10-CM

## 2019-01-06 DIAGNOSIS — E559 Vitamin D deficiency, unspecified: Secondary | ICD-10-CM | POA: Insufficient documentation

## 2019-01-06 DIAGNOSIS — IMO0002 Reserved for concepts with insufficient information to code with codable children: Secondary | ICD-10-CM

## 2019-01-06 DIAGNOSIS — E1165 Type 2 diabetes mellitus with hyperglycemia: Secondary | ICD-10-CM

## 2019-01-06 DIAGNOSIS — I10 Essential (primary) hypertension: Secondary | ICD-10-CM | POA: Diagnosis not present

## 2019-01-06 MED ORDER — VITAMIN D3 125 MCG (5000 UT) PO CAPS: 5000.0000 [IU] | ORAL_CAPSULE | Freq: Every day | ORAL | 0 refills | Status: DC

## 2019-01-06 NOTE — Progress Notes (Signed)
Endocrinology follow-up note   Subjective:    Patient ID: Jillian Adkins, female    DOB: 05/23/1966. Patient is being seen in f/u for management of uncontrolled type 2 diabetes, hypertension, vitamin D deficiency. PCP: Scherrie Bateman  Past Medical History:  Diagnosis Date  . Diabetes mellitus without complication ([place])   . Hypertension    Past Surgical History:  Procedure Laterality Date  . CESAREAN SECTION  [phone]  . COLONOSCOPY N/A 08/08/2018   Procedure: COLONOSCOPY;  Surgeon: Daneil Dolin, MD;  [facility];  Service: Endoscopy;  Laterality: N/A;  11:30  . POLYPECTOMY  08/08/2018   Procedure: POLYPECTOMY;  Surgeon: Daneil Dolin, MD;  [facility];  Service: Endoscopy;;  . TUBAL LIGATION     Social History   Socioeconomic History  . Marital status: Married    Spouse name: Not on file  . Number of children: Not on file  . Years of education: Not on file  . Highest education level: Not on file  Occupational History  . Not on file  Social Needs  . Financial resource strain: Not on file  . Food insecurity:    Worry: Not on file    Inability: Not on file  . Transportation needs:    Medical: Not on file    Non-medical: Not on file  Tobacco Use  . Smoking status: Never Smoker  . Smokeless tobacco: Never Used  Substance and Sexual Activity  . Alcohol use: No  . Drug use: No  . Sexual activity: Yes    Birth control/protection: Surgical    Comment: tubal  Lifestyle  . Physical activity:    Days per week: Not on file    Minutes per session: Not on file  . Stress: Not on file  Relationships  . Social connections:    Talks on phone: Not on file    Gets together: Not on file    Attends religious service: Not on file    Active member of club or organization: Not on file    Attends meetings of clubs or organizations: Not on file    Relationship status: Not on file  Other Topics Concern  . Not on file  Social History  Narrative  . Not on file   Outpatient Encounter Medications as of 01/06/2019  Medication Sig  . Cholecalciferol (VITAMIN D3) 125 MCG (5000 UT) CAPS Take 1 capsule (5,000 Units total) by mouth daily.  . hydrochlorothiazide (HYDRODIURIL) 25 MG tablet TAKE 1 TABLET BY MOUTH EVERY DAY  . insulin degludec (TRESIBA) 100 UNIT/ML SOPN FlexTouch Pen Inject 30 Units into the skin daily at 10 pm.  . Insulin Pen Needle (B-D ULTRAFINE III SHORT PEN) 31G X 8 MM MISC 1 each by Does not apply route as directed.  . metFORMIN (GLUCOPHAGE) 1000 MG tablet Take 500 mg by mouth 2 (two) times daily with a meal.  . spironolactone (ALDACTONE) 50 MG tablet Take 50 mg by mouth daily.   No facility-administered encounter medications on file as of 01/06/2019.    ALLERGIES: No Known Allergies VACCINATION STATUS: Immunization History  Administered Date(s) Administered  . Influenza Whole 09/09/2007, 08/11/2008    Diabetes  She presents for her follow-up diabetic visit. She has type 2 diabetes mellitus. Onset time: She was diagnosed at approximate age of 79 years. Her disease course has been improving. There are no hypoglycemic associated symptoms. Pertinent negatives for hypoglycemia include no confusion, headaches, pallor or seizures. There are no  diabetic associated symptoms. Pertinent negatives for diabetes include no blurred vision, no chest pain, no fatigue, no polydipsia, no polyphagia and no polyuria. There are no hypoglycemic complications. Symptoms are improving. There are no diabetic complications. Risk factors for coronary artery disease include diabetes mellitus, hypertension, obesity, sedentary lifestyle and family history. She is compliant with treatment most of the time. Her weight is fluctuating minimally. She is following a generally unhealthy diet. When asked about meal planning, she reported none. She has not had a previous visit with a dietitian. She rarely participates in exercise. Her breakfast blood  glucose range is generally 140-180 mg/dl. Her bedtime blood glucose range is generally 140-180 mg/dl. Her overall blood glucose range is 140-180 mg/dl. An ACE inhibitor/angiotensin II receptor blocker is being taken. Eye exam is current.  Hypertension  This is a chronic problem. The current episode started more than 1 year ago. The problem is controlled. Pertinent negatives include no blurred vision, chest pain, headaches, palpitations or shortness of breath. Risk factors for coronary artery disease include dyslipidemia, diabetes mellitus, obesity and sedentary lifestyle. Past treatments include angiotensin blockers.     Review of Systems  Constitutional: Negative for chills, fatigue, fever and unexpected weight change.  HENT: Negative for trouble swallowing and voice change.   Eyes: Negative for blurred vision and visual disturbance.  Respiratory: Negative for cough, shortness of breath and wheezing.   Cardiovascular: Negative for chest pain, palpitations and leg swelling.  Gastrointestinal: Negative for diarrhea, nausea and vomiting.  Endocrine: Negative for cold intolerance, heat intolerance, polydipsia, polyphagia and polyuria.  Musculoskeletal: Negative for arthralgias and myalgias.  Skin: Negative for color change, pallor, rash and wound.  Neurological: Negative for seizures and headaches.  Psychiatric/Behavioral: Negative for confusion and suicidal ideas.    Objective:    BP (!) 152/93   Pulse 86   Ht 5\' 4"  (1.626 m)   Wt 269 lb (122 kg)   LMP 10/23/2015   BMI 46.17 kg/m   Wt Readings from Last 3 Encounters:  01/06/19 269 lb (122 kg)  09/01/18 270 lb (122.5 kg)  08/08/18 253 lb (114.8 kg)    Physical Exam  Constitutional: She is oriented to person, place, and time. She appears well-developed.  HENT:  Head: Normocephalic and atraumatic.  Eyes: EOM are normal.  Neck: Normal range of motion. Neck supple. No tracheal deviation present. No thyromegaly present.   Cardiovascular: Normal rate.  Pulmonary/Chest: Effort normal.  Abdominal: There is no abdominal tenderness. There is no guarding.  Musculoskeletal: Normal range of motion.        General: Edema present.  Neurological: She is alert and oriented to person, place, and time. No cranial nerve deficit. Coordination normal.  Skin: Skin is warm and dry. No rash noted. No erythema. No pallor.  Psychiatric: She has a normal mood and affect. Judgment normal.    CMP     Component Value Date/Time   NA 138 08/30/2018 0954   K 4.2 08/30/2018 0954   CL 101 08/30/2018 0954   CO2 22 08/30/2018 0954   GLUCOSE 135 (H) 08/30/2018 0954   GLUCOSE 98 12/28/2017 0914   BUN 19 01/03/2019   CREATININE 1.2 (A) 01/03/2019   CREATININE 1.10 (H) 08/30/2018 0954   CREATININE 1.01 12/28/2017 0914   CALCIUM 9.9 08/30/2018 0954   PROT 7.4 08/30/2018 0954   ALBUMIN 4.0 08/30/2018 0954   AST 18 08/30/2018 0954   ALT 16 08/30/2018 0954   ALKPHOS 106 08/30/2018 0954   BILITOT 0.8 08/30/2018  [place] (L) 08/30/2018 0954   GFRNONAA 65 12/28/2017 0914   GFRAA 68 08/30/2018 0954   GFRAA 76 12/28/2017 0914     Diabetic Labs (most recent): Lab Results  Component Value Date   HGBA1C 7.4 01/03/2019   HGBA1C 7.1 (H) 08/30/2018   HGBA1C 7.3 (H) 04/26/2018     Lipid Panel ( most recent) Lipid Panel     Component Value Date/Time   CHOL 142 02/23/2017 0852   TRIG 66 02/23/2017 0852   HDL 36 (L) 02/23/2017 0852   CHOLHDL 3.9 02/23/2017 0852   VLDL 13 02/23/2017 0852   LDLCALC 93 02/23/2017 0852     Assessment & Plan:   1. Uncontrolled type 2 diabetes mellitus with complication-with CKD  - Patient has currently uncontrolled symptomatic type 2 DM since  53 years of age. -She returns with stable A1c of 7.4%, generally improving from   >14 %. Recent labs reviewed.   - She does not report gross complications, however, patient remains at a high risk for more acute and chronic complications of  diabetes which include CAD, CVA, CKD, retinopathy, and neuropathy. These are all discussed in detail with the patient.  - I have counseled the patient on diet management and weight loss, by adopting a carbohydrate restricted/protein rich diet.  -She is still admits to dietary indiscretions.  - Patient admits there is a room for improvement in her diet and drink choices. -  Suggestion is made for her to avoid simple carbohydrates  from her diet including Cakes, Sweet Desserts / Pastries, Ice Cream, Soda (diet and regular), Sweet Tea, Candies, Chips, Cookies, Store Bought Juices, Alcohol in Excess of  1-2 drinks a day, Artificial Sweeteners, and "Sugar-free" Products. This will help patient to have stable blood glucose profile and potentially avoid unintended weight gain.   - I encouraged the patient to switch to  unprocessed or minimally processed complex starch and increased protein intake (animal or plant source), fruits, and vegetables.  - Patient is advised to stick to a routine mealtimes to eat 3 meals  a day and avoid unnecessary snacks ( to snack only to correct hypoglycemia).   - I have approached patient with the following individualized plan to manage diabetes and patient agrees:   -Patient presents with continued improvement in her glycemic profile, responding to her basal insulin.  -She is advised to increase Tresiba to 30 units nightly, associated with strict monitoring of glucose daily before breakfast and as needed.  -Patient is encouraged to call clinic for blood glucose levels less than 70 or above 200 mg /dl. -She is advised to lower her metformin to 500 mg p.o. twice daily given her elevated creatinine of 1.24 with GFR of 59.   - Patient will be considered for incretin therapy as appropriate next visit. - Patient specific target  A1c;  LDL, HDL, Triglycerides, and  Waist Circumference were discussed in detail.  2) BP/HTN: Her blood pressure is not controlled to target.  She  is advised to continue hydrochlorothiazide 25 mg p.o. daily along with spironolactone 50 mg p.o. daily.     3) Lipids/HPL:   Her LDL is controlled at 93, she is not on statin medications.  She will be considered for fasting lipid panel on subsequent visits.  4)  Weight/Diet:  No success in weight loss, CDE Consult in progress , exercise, and detailed carbohydrates information provided.  5) Chronic Care/Health Maintenance:  -Patient is on ARB and  encouraged to continue  to follow up with Ophthalmology, Podiatrist at least yearly or according to recommendations, and advised to   stay away from smoking. I have recommended yearly flu vaccine and pneumonia vaccination at least every 5 years; moderate intensity exercise for up to 150 minutes weekly; and  sleep for at least 7 hours a day.  - I advised patient to maintain close follow up with her PCP for primary care needs.  - Time spent with the patient: 25 min, of which >50% was spent in reviewing her blood glucose logs , discussing her hypoglycemia and hyperglycemia episodes, reviewing her current and  previous labs / studies and medications  doses and developing a plan to avoid hypoglycemia and hyperglycemia. Please refer to Patient Instructions for Blood Glucose Monitoring and Insulin/Medications Dosing Guide"  in media tab for additional information. Jillian Adkins participated in the discussions, expressed understanding, and voiced agreement with the above plans.  All questions were answered to her satisfaction. she is encouraged to contact clinic should she have any questions or concerns prior to her return visit.   Follow up plan: - Return in about 4 months (around 05/07/2019) for Follow up with Pre-visit Labs, Meter, and Logs.  Glade Lloyd, MD Phone: [phone]  Fax: [phone]  -  This note was partially dictated with voice recognition software. Similar sounding words can be transcribed inadequately or may not  be corrected upon  review.  01/06/2019, 4:38 PM

## 2019-01-06 NOTE — Patient Instructions (Signed)

## 2019-02-09 ENCOUNTER — Other Ambulatory Visit: Payer: Self-pay

## 2019-02-09 MED ORDER — INSULIN DEGLUDEC 100 UNIT/ML ~~LOC~~ SOPN: 30.0000 [IU] | PEN_INJECTOR | Freq: Every day | SUBCUTANEOUS | 2 refills | Status: DC

## 2019-05-06 ENCOUNTER — Other Ambulatory Visit: Payer: Self-pay | Admitting: "Endocrinology

## 2019-05-07 LAB — COMPREHENSIVE METABOLIC PANEL
ALT: 18 IU/L (ref 0–32)
AST: 16 IU/L (ref 0–40)
Albumin/Globulin Ratio: 1.3 (ref 1.2–2.2)
Albumin: 4.4 g/dL (ref 3.8–4.8)
Alkaline Phosphatase: 106 IU/L (ref 39–117)
BUN/Creatinine Ratio: 16 (ref 9–23)
BUN: 21 mg/dL (ref 6–24)
Bilirubin Total: 0.7 mg/dL (ref 0.0–1.2)
CO2: 25 mmol/L (ref 20–29)
Calcium: 9.9 mg/dL (ref 8.7–10.2)
Chloride: 96 mmol/L (ref 96–106)
Creatinine, Ser: 1.31 mg/dL — ABNORMAL HIGH (ref 0.57–1.00)
GFR calc Af Amer: 55 mL/min/{1.73_m2} — ABNORMAL LOW (ref >59)
GFR calc non Af Amer: 48 mL/min/{1.73_m2} — ABNORMAL LOW (ref >59)
Globulin, Total: 3.3 g/dL (ref 1.5–4.5)
Glucose: 145 mg/dL — ABNORMAL HIGH (ref 65–99)
Potassium: 4.6 mmol/L (ref 3.5–5.2)
Sodium: 137 mmol/L (ref 134–144)
Total Protein: 7.7 g/dL (ref 6.0–8.5)

## 2019-05-07 LAB — VITAMIN D PNL(25-HYDRXY+1,25-DIHY)-BLD
Vit D, 1,25-Dihydroxy: 28 pg/mL (ref 19.9–79.3)
Vit D, 25-Hydroxy: 50.4 ng/mL (ref 30.0–100.0)

## 2019-05-07 LAB — HGB A1C W/O EAG: Hgb A1c MFr Bld: 7.9 % — ABNORMAL HIGH (ref 4.8–5.6)

## 2019-05-11 ENCOUNTER — Encounter: Payer: Self-pay | Admitting: "Endocrinology

## 2019-05-11 ENCOUNTER — Ambulatory Visit (INDEPENDENT_AMBULATORY_CARE_PROVIDER_SITE_OTHER): Payer: 59 | Admitting: "Endocrinology

## 2019-05-11 ENCOUNTER — Other Ambulatory Visit: Payer: Self-pay

## 2019-05-11 VITALS — BP 171/123 | HR 93 | Ht 64.0 in | Wt 276.4 lb

## 2019-05-11 DIAGNOSIS — E559 Vitamin D deficiency, unspecified: Secondary | ICD-10-CM | POA: Diagnosis not present

## 2019-05-11 DIAGNOSIS — E1165 Type 2 diabetes mellitus with hyperglycemia: Secondary | ICD-10-CM

## 2019-05-11 DIAGNOSIS — IMO0002 Reserved for concepts with insufficient information to code with codable children: Secondary | ICD-10-CM

## 2019-05-11 DIAGNOSIS — E118 Type 2 diabetes mellitus with unspecified complications: Secondary | ICD-10-CM | POA: Diagnosis not present

## 2019-05-11 DIAGNOSIS — I10 Essential (primary) hypertension: Secondary | ICD-10-CM | POA: Diagnosis not present

## 2019-05-11 MED ORDER — LANTUS SOLOSTAR 100 UNIT/ML ~~LOC~~ SOPN: 40.0000 [IU] | PEN_INJECTOR | Freq: Every day | SUBCUTANEOUS | 3 refills | Status: DC

## 2019-05-11 NOTE — Patient Instructions (Signed)

## 2019-05-11 NOTE — Progress Notes (Signed)
Endocrinology follow-up note   Subjective:    Patient ID: Jillian Adkins, female    DOB: 1966-02-12. Patient is being seen in f/u for management of uncontrolled type 2 diabetes, hypertension, vitamin D deficiency. PCP: Scherrie Bateman  Past Medical History:  Diagnosis Date  . Diabetes mellitus without complication ([place])   . Hypertension    Past Surgical History:  Procedure Laterality Date  . CESAREAN SECTION  [phone]  . COLONOSCOPY N/A 08/08/2018   Procedure: COLONOSCOPY;  Surgeon: Daneil Dolin, MD;  Location: [facility];  Service: Endoscopy;  Laterality: N/A;  11:30  . POLYPECTOMY  08/08/2018   Procedure: POLYPECTOMY;  Surgeon: Daneil Dolin, MD;  Location: [facility];  Service: Endoscopy;;  . TUBAL LIGATION     Social History   Socioeconomic History  . Marital status: Married    Spouse name: Not on file  . Number of children: Not on file  . Years of education: Not on file  . Highest education level: Not on file  Occupational History  . Not on file  Social Needs  . Financial resource strain: Not on file  . Food insecurity    Worry: Not on file    Inability: Not on file  . Transportation needs    Medical: Not on file    Non-medical: Not on file  Tobacco Use  . Smoking status: Never Smoker  . Smokeless tobacco: Never Used  Substance and Sexual Activity  . Alcohol use: No  . Drug use: No  . Sexual activity: Yes    Birth control/protection: Surgical    Comment: tubal  Lifestyle  . Physical activity    Days per week: Not on file    Minutes per session: Not on file  . Stress: Not on file  Relationships  . Social Herbalist on phone: Not on file    Gets together: Not on file    Attends religious service: Not on file    Active member of club or organization: Not on file    Attends meetings of clubs or organizations: Not on file    Relationship status: Not on file  Other Topics Concern  . Not on file  Social History  Narrative  . Not on file   Outpatient Encounter Medications as of 05/11/2019  Medication Sig  . Cholecalciferol (VITAMIN D3) 125 MCG (5000 UT) CAPS Take 1 capsule (5,000 Units total) by mouth daily.  . hydrochlorothiazide (HYDRODIURIL) 25 MG tablet TAKE 1 TABLET BY MOUTH EVERY DAY  . Insulin Glargine (LANTUS SOLOSTAR) 100 UNIT/ML Solostar Pen Inject 40 Units into the skin daily.  . Insulin Pen Needle (B-D ULTRAFINE III SHORT PEN) 31G X 8 MM MISC 1 each by Does not apply route as directed.  . metFORMIN (GLUCOPHAGE) 1000 MG tablet Take 500 mg by mouth 2 (two) times daily with a meal.  . spironolactone (ALDACTONE) 50 MG tablet Take 50 mg by mouth daily.  . [DISCONTINUED] insulin degludec (TRESIBA) 100 UNIT/ML SOPN FlexTouch Pen Inject 0.3 mLs (30 Units total) into the skin daily at 10 pm.   No facility-administered encounter medications on file as of 05/11/2019.    ALLERGIES: No Known Allergies VACCINATION STATUS: Immunization History  Administered Date(s) Administered  . Influenza Whole 09/09/2007, 08/11/2008    Diabetes She presents for her follow-up diabetic visit. She has type 2 diabetes mellitus. Onset time: She was diagnosed at approximate age of 47 years. Her disease course has been worsening.  There are no hypoglycemic associated symptoms. Pertinent negatives for hypoglycemia include no confusion, headaches, pallor or seizures. There are no diabetic associated symptoms. Pertinent negatives for diabetes include no blurred vision, no chest pain, no fatigue, no polydipsia, no polyphagia and no polyuria. There are no hypoglycemic complications. Symptoms are worsening. There are no diabetic complications. Risk factors for coronary artery disease include diabetes mellitus, hypertension, obesity, sedentary lifestyle and family history. She is compliant with treatment most of the time. Her weight is fluctuating minimally. She is following a generally unhealthy diet. When asked about meal planning,  she reported none. She has not had a previous visit with a dietitian. She rarely participates in exercise. Her breakfast blood glucose range is generally 140-180 mg/dl. Her bedtime blood glucose range is generally 140-180 mg/dl. Her overall blood glucose range is 140-180 mg/dl. An ACE inhibitor/angiotensin II receptor blocker is being taken. Eye exam is current.  Hypertension This is a chronic problem. The current episode started more than 1 year ago. The problem is controlled. Pertinent negatives include no blurred vision, chest pain, headaches, palpitations or shortness of breath. Risk factors for coronary artery disease include dyslipidemia, diabetes mellitus, obesity and sedentary lifestyle. Past treatments include angiotensin blockers.     Review of Systems  Constitutional: Negative for chills, fatigue, fever and unexpected weight change.  HENT: Negative for trouble swallowing and voice change.   Eyes: Negative for blurred vision and visual disturbance.  Respiratory: Negative for cough, shortness of breath and wheezing.   Cardiovascular: Negative for chest pain, palpitations and leg swelling.  Gastrointestinal: Negative for diarrhea, nausea and vomiting.  Endocrine: Negative for cold intolerance, heat intolerance, polydipsia, polyphagia and polyuria.  Musculoskeletal: Negative for arthralgias and myalgias.  Skin: Negative for color change, pallor, rash and wound.  Neurological: Negative for seizures and headaches.  Psychiatric/Behavioral: Negative for confusion and suicidal ideas.    Objective:    BP (!) 171/123 (BP Location: Left Arm, Patient Position: Sitting, Cuff Size: Normal)   Pulse 93   Ht 5\' 4"  (1.626 m)   Wt 276 lb 6.4 oz (125.4 kg)   LMP 10/23/2015   SpO2 96%   BMI 47.44 kg/m   Wt Readings from Last 3 Encounters:  05/11/19 276 lb 6.4 oz (125.4 kg)  01/06/19 269 lb (122 kg)  09/01/18 270 lb (122.5 kg)    Physical Exam  Constitutional: She is oriented to person,  place, and time. She appears well-developed.  HENT:  Head: Normocephalic and atraumatic.  Eyes: EOM are normal.  Neck: Normal range of motion. Neck supple. No tracheal deviation present. No thyromegaly present.  Cardiovascular: Normal rate.  Pulmonary/Chest: Effort normal.  Abdominal: There is no abdominal tenderness. There is no guarding.  Musculoskeletal: Normal range of motion.        General: No edema.  Neurological: She is alert and oriented to person, place, and time. No cranial nerve deficit. Coordination normal.  Skin: Skin is warm and dry. No rash noted. No erythema. No pallor.  Psychiatric: She has a normal mood and affect. Judgment normal.    CMP     Component Value Date/Time   NA 137 05/06/2019 0812   K 4.6 05/06/2019 0812   CL 96 05/06/2019 0812   CO2 25 05/06/2019 0812   GLUCOSE 145 (H) 05/06/2019 0812   GLUCOSE 98 12/28/2017 0914   BUN 21 05/06/2019 0812   CREATININE 1.31 (H) 05/06/2019 0812   CREATININE 1.01 12/28/2017 0914   CALCIUM 9.9 05/06/2019 0812   PROT  7.7 05/06/2019 0812   ALBUMIN 4.4 05/06/2019 0812   AST 16 05/06/2019 0812   ALT 18 05/06/2019 0812   ALKPHOS 106 05/06/2019 0812   BILITOT 0.7 05/06/2019 0812   GFRNONAA 48 (L) 05/06/2019 0812   GFRNONAA 65 12/28/2017 0914   GFRAA 55 (L) 05/06/2019 0812   GFRAA 76 12/28/2017 0914     Diabetic Labs (most recent): Lab Results  Component Value Date   HGBA1C 7.9 (H) 05/06/2019   HGBA1C 7.4 (H) 01/03/2019   HGBA1C 7.4 01/03/2019     Lipid Panel ( most recent) Lipid Panel     Component Value Date/Time   CHOL 142 02/23/2017 0852   TRIG 66 02/23/2017 0852   HDL 36 (L) 02/23/2017 0852   CHOLHDL 3.9 02/23/2017 0852   VLDL 13 02/23/2017 0852   LDLCALC 93 02/23/2017 0852     Assessment & Plan:   1. Uncontrolled type 2 diabetes mellitus with complication-with CKD  - Patient has currently uncontrolled symptomatic type 2 DM since  53 years of age. -She returns with higher A1c of 7.9%  increasing from 7.4%.     - She does not report gross complications, however, patient remains at a high risk for more acute and chronic complications of diabetes which include CAD, CVA, CKD, retinopathy, and neuropathy. These are all discussed in detail with the patient.  - I have counseled the patient on diet management and weight loss, by adopting a carbohydrate restricted/protein rich diet.  - she  admits there is a room for improvement in her diet and drink choices. -  Suggestion is made for her to avoid simple carbohydrates  from her diet including Cakes, Sweet Desserts / Pastries, Ice Cream, Soda (diet and regular), Sweet Tea, Candies, Chips, Cookies, Sweet Pastries,  Store Bought Juices, Alcohol in Excess of  1-2 drinks a day, Artificial Sweeteners, Coffee Creamer, and "Sugar-free" Products. This will help patient to have stable blood glucose profile and potentially avoid unintended weight gain.  - I encouraged the patient to switch to  unprocessed or minimally processed complex starch and increased protein intake (animal or plant source), fruits, and vegetables.  - Patient is advised to stick to a routine mealtimes to eat 3 meals  a day and avoid unnecessary snacks ( to snack only to correct hypoglycemia).   - I have approached patient with the following individualized plan to manage diabetes and patient agrees:   -He is contacted by her insurance to switch her [place] to Lantus. she will continue to require insulin treatment in order for her to maintain control of diabetes target. -She is advised to increase Lantus to 40 units nightly,  associated with strict monitoring of glucose daily before breakfast and as needed.  -Patient is encouraged to call clinic for blood glucose levels less than 70 or above 200 mg /dl. -She is advised to continue metformin  500 mg p.o. twice daily . - Patient will be considered for incretin therapy as appropriate next visit. - Patient specific target  A1c;   LDL, HDL, Triglycerides, and  Waist Circumference were discussed in detail.  2) BP/HTN: Her blood pressure is not controlled to target.  She reports that she has not been consistent taking her meds.  she is advised to continue hydrochlorothiazide 25 mg p.o. daily along with spironolactone 50 mg p.o. daily.     3) Lipids/HPL:   Her LDL is controlled at 93, she is not on statin medications.  She will be considered for fasting lipid  panel on subsequent visits.  4)  Weight/Diet:  No success in weight loss, CDE Consult in progress , exercise, and detailed carbohydrates information provided.  5) Chronic Care/Health Maintenance:  -Patient is on ARB and  encouraged to continue to follow up with Ophthalmology, Podiatrist at least yearly or according to recommendations, and advised to   stay away from smoking. I have recommended yearly flu vaccine and pneumonia vaccination at least every 5 years; moderate intensity exercise for up to 150 minutes weekly; and  sleep for at least 7 hours a day. She is now vitamin D replete.  - I advised patient to maintain close follow up with her PCP for primary care needs.  - Time spent with the patient: 25 min, of which >50% was spent in reviewing her blood glucose logs , discussing her hypoglycemia and hyperglycemia episodes, reviewing her current and  previous labs / studies and medications  doses and developing a plan to avoid hypoglycemia and hyperglycemia. Please refer to Patient Instructions for Blood Glucose Monitoring and Insulin/Medications Dosing Guide"  in media tab for additional information. Please  also refer to " Patient Self Inventory" in the Media  tab for reviewed elements of pertinent patient history.  Jillian Adkins participated in the discussions, expressed understanding, and voiced agreement with the above plans.  All questions were answered to her satisfaction. she is encouraged to contact clinic should she have any questions or concerns prior to her  return visit.   Follow up plan: - Return in about 4 months (around 09/10/2019) for Follow up with Pre-visit Labs, Meter, and Logs.  Glade Lloyd, MD Phone: [phone]  Fax: [phone]  -  This note was partially dictated with voice recognition software. Similar sounding words can be transcribed inadequately or may not  be corrected upon review.  05/11/2019, 4:51 PM

## 2019-05-14 ENCOUNTER — Other Ambulatory Visit (HOSPITAL_COMMUNITY): Payer: Self-pay | Admitting: Family Medicine

## 2019-05-14 DIAGNOSIS — Z1231 Encounter for screening mammogram for malignant neoplasm of breast: Secondary | ICD-10-CM

## 2019-07-15 ENCOUNTER — Telehealth: Payer: Self-pay | Admitting: "Endocrinology

## 2019-07-15 MED ORDER — SPIRONOLACTONE 50 MG PO TABS: 50.0000 mg | ORAL_TABLET | Freq: Every day | ORAL | 0 refills | Status: DC

## 2019-07-15 NOTE — Telephone Encounter (Signed)
Patient is requesting refill on spironolactone (ALDACTONE) 50 MG tablet. [facility] in [place]

## 2019-07-15 NOTE — Telephone Encounter (Signed)
Rx sent 

## 2019-09-14 ENCOUNTER — Encounter: Payer: Self-pay | Admitting: "Endocrinology

## 2019-09-14 ENCOUNTER — Other Ambulatory Visit: Payer: Self-pay

## 2019-09-14 ENCOUNTER — Ambulatory Visit (INDEPENDENT_AMBULATORY_CARE_PROVIDER_SITE_OTHER): Payer: 59 | Admitting: "Endocrinology

## 2019-09-14 VITALS — BP 157/97 | HR 84 | Ht 64.0 in | Wt 278.0 lb

## 2019-09-14 DIAGNOSIS — E1165 Type 2 diabetes mellitus with hyperglycemia: Secondary | ICD-10-CM

## 2019-09-14 DIAGNOSIS — E118 Type 2 diabetes mellitus with unspecified complications: Secondary | ICD-10-CM

## 2019-09-14 DIAGNOSIS — E559 Vitamin D deficiency, unspecified: Secondary | ICD-10-CM

## 2019-09-14 DIAGNOSIS — IMO0002 Reserved for concepts with insufficient information to code with codable children: Secondary | ICD-10-CM

## 2019-09-14 DIAGNOSIS — I10 Essential (primary) hypertension: Secondary | ICD-10-CM | POA: Diagnosis not present

## 2019-09-14 LAB — POCT GLYCOSYLATED HEMOGLOBIN (HGB A1C): HbA1c POC (<> result, manual entry): 9.3 % (ref 4.0–5.6)

## 2019-09-14 MED ORDER — LANTUS SOLOSTAR 100 UNIT/ML ~~LOC~~ SOPN: 50.0000 [IU] | PEN_INJECTOR | Freq: Every day | SUBCUTANEOUS | 3 refills | Status: DC

## 2019-09-14 MED ORDER — VITAMIN D3 125 MCG (5000 UT) PO CAPS: 5000.0000 [IU] | ORAL_CAPSULE | Freq: Every day | ORAL | 0 refills | Status: DC

## 2019-09-14 MED ORDER — GLIPIZIDE ER 5 MG PO TB24: 5.0000 mg | ORAL_TABLET | Freq: Every day | ORAL | 3 refills | Status: DC

## 2019-09-14 NOTE — Progress Notes (Signed)
09/14/2019  Endocrinology follow-up note  Subjective:    Patient ID: Jillian Adkins, female    DOB: 16-Nov-1966. Patient is being seen in f/u for management of uncontrolled type 2 diabetes, hypertension, vitamin D deficiency. PCP: Scherrie Bateman  Past Medical History:  Diagnosis Date  . Diabetes mellitus without complication ([place])   . Hypertension    Past Surgical History:  Procedure Laterality Date  . CESAREAN SECTION  [phone]  . COLONOSCOPY N/A 08/08/2018   Procedure: COLONOSCOPY;  Surgeon: Daneil Dolin, MD;  [facility];  Service: Endoscopy;  Laterality: N/A;  11:30  . POLYPECTOMY  08/08/2018   Procedure: POLYPECTOMY;  Surgeon: Daneil Dolin, MD;  Location: [facility];  Service: Endoscopy;;  . TUBAL LIGATION     Social History   Socioeconomic History  . Marital status: Married    Spouse name: Not on file  . Number of children: Not on file  . Years of education: Not on file  . Highest education level: Not on file  Occupational History  . Not on file  Social Needs  . Financial resource strain: Not on file  . Food insecurity    Worry: Not on file    Inability: Not on file  . Transportation needs    Medical: Not on file    Non-medical: Not on file  Tobacco Use  . Smoking status: Never Smoker  . Smokeless tobacco: Never Used  Substance and Sexual Activity  . Alcohol use: No  . Drug use: No  . Sexual activity: Yes    Birth control/protection: Surgical    Comment: tubal  Lifestyle  . Physical activity    Days per week: Not on file    Minutes per session: Not on file  . Stress: Not on file  Relationships  . Social Herbalist on phone: Not on file    Gets together: Not on file    Attends religious service: Not on file    Active member of club or organization: Not on file    Attends meetings of clubs or organizations: Not on file    Relationship status: Not on file  Other Topics Concern  . Not on file  Social  History Narrative  . Not on file   Outpatient Encounter Medications as of 09/14/2019  Medication Sig  . Cholecalciferol (VITAMIN D3) 125 MCG (5000 UT) CAPS Take 1 capsule (5,000 Units total) by mouth daily.  Marland Kitchen glipiZIDE (GLUCOTROL XL) 5 MG 24 hr tablet Take 1 tablet (5 mg total) by mouth daily with breakfast.  . hydrochlorothiazide (HYDRODIURIL) 25 MG tablet TAKE 1 TABLET BY MOUTH EVERY DAY  . Insulin Glargine (LANTUS SOLOSTAR) 100 UNIT/ML Solostar Pen Inject 50 Units into the skin at bedtime.  . Insulin Pen Needle (B-D ULTRAFINE III SHORT PEN) 31G X 8 MM MISC 1 each by Does not apply route as directed.  . metFORMIN (GLUCOPHAGE) 1000 MG tablet Take 500 mg by mouth 2 (two) times daily with a meal.  . spironolactone (ALDACTONE) 50 MG tablet Take 1 tablet (50 mg total) by mouth daily.  . [DISCONTINUED] Cholecalciferol (VITAMIN D3) 125 MCG (5000 UT) CAPS Take 1 capsule (5,000 Units total) by mouth daily. (Patient not taking: Reported on 09/14/2019)  . [DISCONTINUED] Insulin Glargine (LANTUS SOLOSTAR) 100 UNIT/ML Solostar Pen Inject 40 Units into the skin daily.   No facility-administered encounter medications on file as of 09/14/2019.    ALLERGIES: No Known Allergies VACCINATION STATUS:  Immunization History  Administered Date(s) Administered  . Influenza Whole 09/09/2007, 08/11/2008    Diabetes She presents for her follow-up diabetic visit. She has type 2 diabetes mellitus. Onset time: She was diagnosed at approximate age of 19 years. Her disease course has been worsening. There are no hypoglycemic associated symptoms. Pertinent negatives for hypoglycemia include no confusion, headaches, pallor or seizures. There are no diabetic associated symptoms. Pertinent negatives for diabetes include no blurred vision, no chest pain, no fatigue, no polydipsia, no polyphagia and no polyuria. There are no hypoglycemic complications. Symptoms are worsening. There are no diabetic complications. Risk factors  for coronary artery disease include diabetes mellitus, hypertension, obesity, sedentary lifestyle and family history. She is compliant with treatment most of the time. Her weight is fluctuating minimally. She is following a generally unhealthy diet. When asked about meal planning, she reported none. She has not had a previous visit with a dietitian. She rarely participates in exercise. Her breakfast blood glucose range is generally 140-180 mg/dl. Her bedtime blood glucose range is generally 180-200 mg/dl. Her overall blood glucose range is 180-200 mg/dl. An ACE inhibitor/angiotensin II receptor blocker is being taken. Eye exam is current.  Hypertension This is a chronic problem. The current episode started more than 1 year ago. The problem is controlled. Pertinent negatives include no blurred vision, chest pain, headaches, palpitations or shortness of breath. Risk factors for coronary artery disease include dyslipidemia, diabetes mellitus, obesity and sedentary lifestyle. Past treatments include angiotensin blockers.   Constitutional: + weight gain, no fatigue, no subjective hyperthermia, no subjective hypothermia Eyes: no blurry vision, no xerophthalmia ENT: no sore throat, no nodules palpated in throat, no dysphagia/odynophagia, no hoarseness Cardiovascular: no Chest Pain, no Shortness of Breath, no palpitations, no leg swelling Respiratory: no cough, no SOB Gastrointestinal: no Nausea/Vomiting/Diarhhea Musculoskeletal: no muscle/joint aches Skin: no rashes Neurological: no tremors, no numbness, no tingling, no dizziness Psychiatric: no depression, no anxiety   Objective:    BP (!) 157/97   Pulse 84   Ht 5\' 4"  (1.626 m)   Wt 278 lb (126.1 kg)   LMP 10/23/2015   BMI 47.72 kg/m   Wt Readings from Last 3 Encounters:  09/14/19 278 lb (126.1 kg)  05/11/19 276 lb 6.4 oz (125.4 kg)  01/06/19 269 lb (122 kg)    Physical Exam  Constitutional: She is oriented to person, place, and time. She  appears well-developed.  HENT:  Head: Normocephalic and atraumatic.  Eyes: EOM are normal.  Neck: Normal range of motion. Neck supple. No tracheal deviation present. No thyromegaly present.  Cardiovascular: Normal rate.  Pulmonary/Chest: Effort normal.  Abdominal: There is no abdominal tenderness. There is no guarding.  Musculoskeletal: Normal range of motion.        General: No edema.  Neurological: She is alert and oriented to person, place, and time. No cranial nerve deficit. Coordination normal.  Skin: Skin is warm and dry. No rash noted. No erythema. No pallor.  Psychiatric: She has a normal mood and affect. Judgment normal.    CMP     Component Value Date/Time   NA 137 05/06/2019 0812   K 4.6 05/06/2019 0812   CL 96 05/06/2019 0812   CO2 25 05/06/2019 0812   GLUCOSE 145 (H) 05/06/2019 0812   GLUCOSE 98 12/28/2017 0914   BUN 21 05/06/2019 0812   CREATININE 1.31 (H) 05/06/2019 0812   CREATININE 1.01 12/28/2017 0914   CALCIUM 9.9 05/06/2019 0812   PROT 7.7 05/06/2019 0812   ALBUMIN  4.4 05/06/2019 0812   AST 16 05/06/2019 0812   ALT 18 05/06/2019 0812   ALKPHOS 106 05/06/2019 0812   BILITOT 0.7 05/06/2019 0812   GFRNONAA 48 (L) 05/06/2019 0812   GFRNONAA 65 12/28/2017 0914   GFRAA 55 (L) 05/06/2019 0812   GFRAA 76 12/28/2017 0914     Diabetic Labs (most recent): Lab Results  Component Value Date   HGBA1C 9.3 09/14/2019   HGBA1C 7.9 (H) 05/06/2019   HGBA1C 7.4 (H) 01/03/2019     Lipid Panel ( most recent) Lipid Panel     Component Value Date/Time   CHOL 142 02/23/2017 0852   TRIG 66 02/23/2017 0852   HDL 36 (L) 02/23/2017 0852   CHOLHDL 3.9 02/23/2017 0852   VLDL 13 02/23/2017 0852   LDLCALC 93 02/23/2017 0852     Assessment & Plan:   1. Uncontrolled type 2 diabetes mellitus with complication-with CKD  - Patient has currently uncontrolled symptomatic type 2 DM since  53 years of age. -She returns with higher A1c of 9.3%, increasing from 7.9%.  She  has significantly above target glycemic profile both fasting and postprandial.     -Her diabetes is complicated by obesity/sedentary life, she does not report gross complications, however, patient remains at a high risk for more acute and chronic complications of diabetes which include CAD, CVA, CKD, retinopathy, and neuropathy. These are all discussed in detail with the patient.  - I have counseled the patient on diet management and weight loss, by adopting a carbohydrate restricted/protein rich diet.  - she  admits there is a room for improvement in her diet and drink choices. -  Suggestion is made for her to avoid simple carbohydrates  from her diet including Cakes, Sweet Desserts / Pastries, Ice Cream, Soda (diet and regular), Sweet Tea, Candies, Chips, Cookies, Sweet Pastries,  Store Bought Juices, Alcohol in Excess of  1-2 drinks a day, Artificial Sweeteners, Coffee Creamer, and "Sugar-free" Products. This will help patient to have stable blood glucose profile and potentially avoid unintended weight gain.  - I encouraged the patient to switch to  unprocessed or minimally processed complex starch and increased protein intake (animal or plant source), fruits, and vegetables.  - Patient is advised to stick to a routine mealtimes to eat 3 meals  a day and avoid unnecessary snacks ( to snack only to correct hypoglycemia).   - I have approached patient with the following individualized plan to manage diabetes and patient agrees:   -She is advised to increase her Lantus to 50 units nightly, associated with monitoring of blood glucose 2 times a day-daily before breakfast and at bedtime.   -Patient is encouraged to call clinic for blood glucose levels less than 70 or above 200 mg /dl. -She is advised to continue metformin  500 mg p.o. twice daily .  -She will benefit from low-dose glipizide therapy.  I discussed and added glipizide 5 mg XL daily at breakfast. - Patient will be considered for incretin  therapy as appropriate next visit. - Patient specific target  A1c;  LDL, HDL, Triglycerides, and  Waist Circumference were discussed in detail.  2) BP/HTN: Her blood pressure is not controlled to target.  She reports that she has not been consistent taking her meds.  she is advised to continue hydrochlorothiazide 25 mg p.o. daily along with spironolactone 50 mg p.o. daily.     3) Lipids/HPL:   Her LDL is controlled at 93, she is not on statin medications.  She will be considered for fasting lipid panel on subsequent visits.  4)  Weight/Diet: Her BMI is 47 -clearly complicating the care of her diabetes.  No success in weight loss, CDE Consult in progress , exercise, and detailed carbohydrates information provided. She would benefit from bariatric surgery, will discuss in further detail on subsequent visits.  5) Chronic Care/Health Maintenance:  -Patient is on ARB and  encouraged to continue to follow up with Ophthalmology, Podiatrist at least yearly or according to recommendations, and advised to   stay away from smoking. I have recommended yearly flu vaccine and pneumonia vaccination at least every 5 years; moderate intensity exercise for up to 150 minutes weekly; and  sleep for at least 7 hours a day. She is now vitamin D replete.  - I advised patient to maintain close follow up with her PCP for primary care needs.  - Time spent with the patient: 25 min, of which >50% was spent in reviewing her blood glucose logs , discussing her hypoglycemia and hyperglycemia episodes, reviewing her current and  previous labs / studies and medications  doses and developing a plan to avoid hypoglycemia and hyperglycemia. Please refer to Patient Instructions for Blood Glucose Monitoring and Insulin/Medications Dosing Guide"  in media tab for additional information. Please  also refer to " Patient Self Inventory" in the Media  tab for reviewed elements of pertinent patient history.  Roselle Locus participated in  the discussions, expressed understanding, and voiced agreement with the above plans.  All questions were answered to her satisfaction. she is encouraged to contact clinic should she have any questions or concerns prior to her return visit.  Follow up plan: - Return in about 3 months (around 12/15/2019) for Bring Meter and Logs- A1c in Office, Follow up with Pre-visit Labs.  Glade Lloyd, MD Phone: [phone]  Fax: [phone]  -  This note was partially dictated with voice recognition software. Similar sounding words can be transcribed inadequately or may not  be corrected upon review.  09/14/2019, 4:59 PM

## 2019-09-14 NOTE — Patient Instructions (Signed)

## 2019-09-21 ENCOUNTER — Other Ambulatory Visit: Payer: Self-pay

## 2019-09-21 ENCOUNTER — Ambulatory Visit (HOSPITAL_COMMUNITY)
Admission: RE | Admit: 2019-09-21 | Discharge: 2019-09-21 | Disposition: A | Discharge status: Home / Self Care | Payer: 59 | Source: Ambulatory Visit | Attending: Family Medicine | Admitting: Family Medicine

## 2019-09-21 DIAGNOSIS — Z1231 Encounter for screening mammogram for malignant neoplasm of breast: Secondary | ICD-10-CM | POA: Diagnosis present

## 2019-09-26 ENCOUNTER — Other Ambulatory Visit: Payer: Self-pay | Admitting: "Endocrinology

## 2019-10-19 ENCOUNTER — Other Ambulatory Visit: Payer: Self-pay | Admitting: "Endocrinology

## 2019-10-20 NOTE — Telephone Encounter (Signed)
Are we prescribing spironolactone for pt or was this temporary?

## 2019-12-11 ENCOUNTER — Other Ambulatory Visit: Payer: Self-pay | Admitting: "Endocrinology

## 2019-12-17 ENCOUNTER — Ambulatory Visit: Payer: 59 | Admitting: "Endocrinology

## 2020-01-16 ENCOUNTER — Other Ambulatory Visit: Payer: Self-pay | Admitting: "Endocrinology

## 2020-06-02 DIAGNOSIS — E1165 Type 2 diabetes mellitus with hyperglycemia: Secondary | ICD-10-CM | POA: Diagnosis not present

## 2020-06-02 DIAGNOSIS — E7849 Other hyperlipidemia: Secondary | ICD-10-CM | POA: Diagnosis not present

## 2020-06-02 DIAGNOSIS — I10 Essential (primary) hypertension: Secondary | ICD-10-CM | POA: Diagnosis not present

## 2020-06-02 DIAGNOSIS — Z6841 Body Mass Index (BMI) 40.0 and over, adult: Secondary | ICD-10-CM | POA: Diagnosis not present

## 2020-09-08 DIAGNOSIS — I10 Essential (primary) hypertension: Secondary | ICD-10-CM | POA: Diagnosis not present

## 2020-09-08 DIAGNOSIS — Z6841 Body Mass Index (BMI) 40.0 and over, adult: Secondary | ICD-10-CM | POA: Diagnosis not present

## 2020-09-08 DIAGNOSIS — E1165 Type 2 diabetes mellitus with hyperglycemia: Secondary | ICD-10-CM | POA: Diagnosis not present

## 2020-09-08 DIAGNOSIS — E7849 Other hyperlipidemia: Secondary | ICD-10-CM | POA: Diagnosis not present

## 2020-09-23 ENCOUNTER — Other Ambulatory Visit (HOSPITAL_COMMUNITY): Payer: Self-pay | Admitting: Family Medicine

## 2020-09-23 DIAGNOSIS — Z1231 Encounter for screening mammogram for malignant neoplasm of breast: Secondary | ICD-10-CM

## 2020-10-03 ENCOUNTER — Other Ambulatory Visit: Payer: Self-pay

## 2020-10-03 ENCOUNTER — Ambulatory Visit (HOSPITAL_COMMUNITY)
Admission: RE | Admit: 2020-10-03 | Discharge: 2020-10-03 | Disposition: A | Discharge status: Home / Self Care | Payer: BC Managed Care – PPO | Source: Ambulatory Visit | Attending: Family Medicine | Admitting: Family Medicine

## 2020-10-03 DIAGNOSIS — Z1231 Encounter for screening mammogram for malignant neoplasm of breast: Secondary | ICD-10-CM | POA: Diagnosis not present

## 2020-10-19 IMAGING — MG DIGITAL SCREENING BILAT W/ TOMO W/ CAD
8 series · 8 of 24 positions shown · non-contrast
Comparison: Previous exam(s).

CLINICAL DATA: Screening.

EXAM:
DIGITAL SCREENING BILATERAL MAMMOGRAM WITH TOMO AND CAD

[L MLO synth-2D]
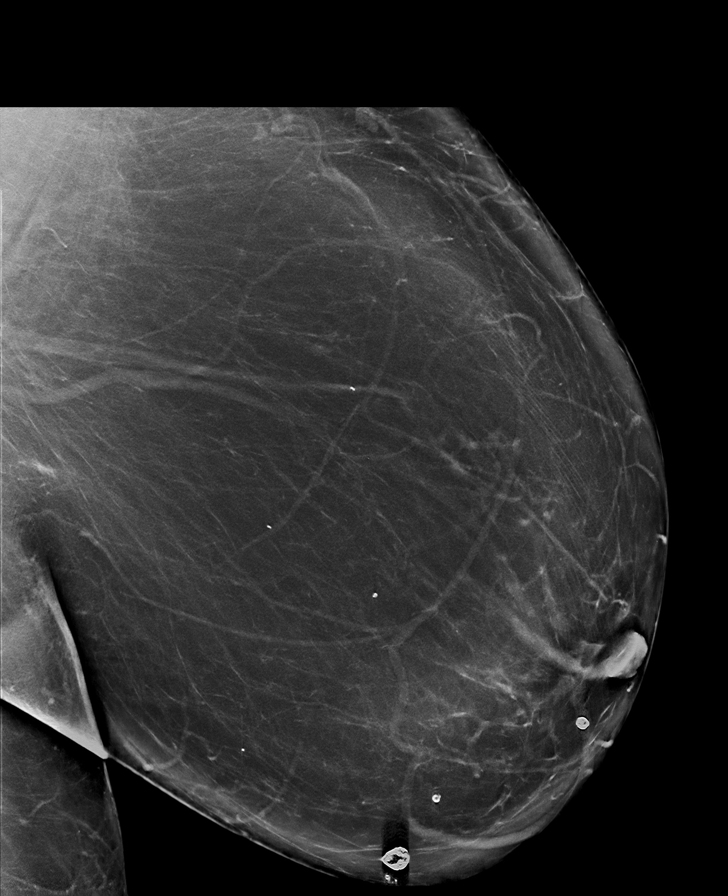

[R CC synth-2D]
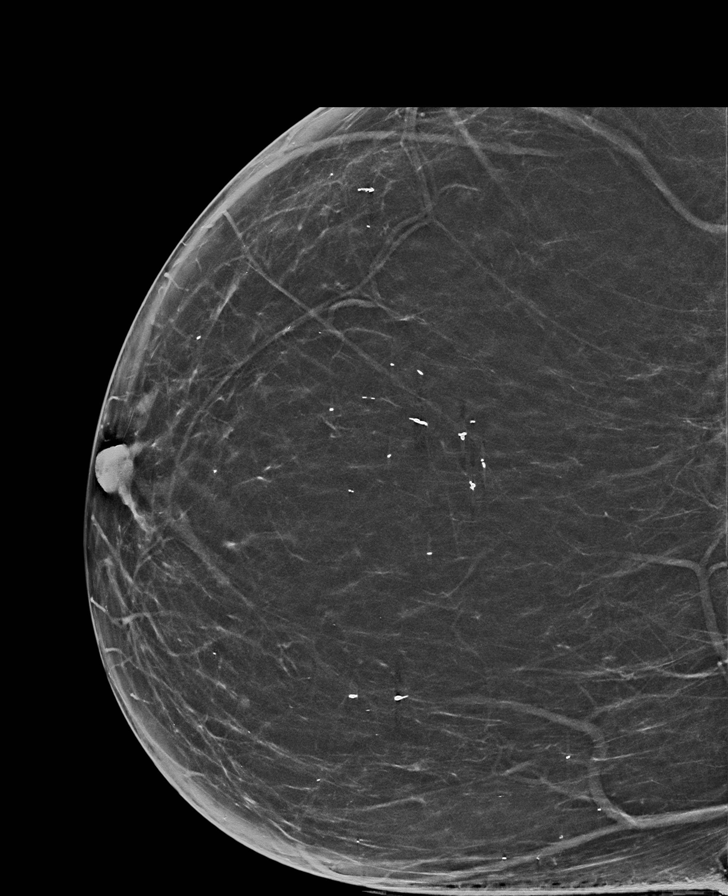

[R MLO synth-2D]
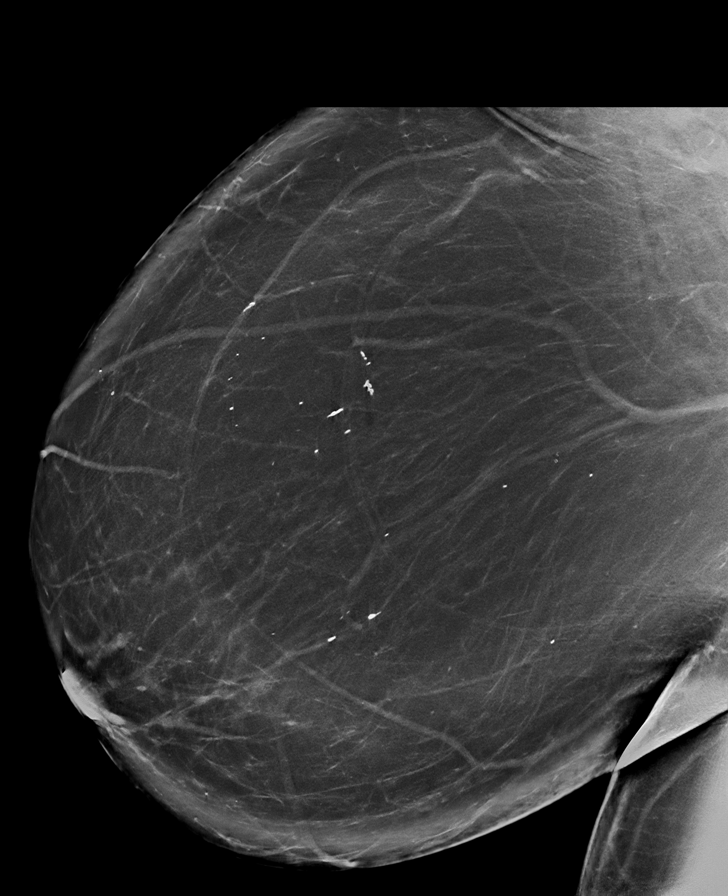

[L CC synth-2D]
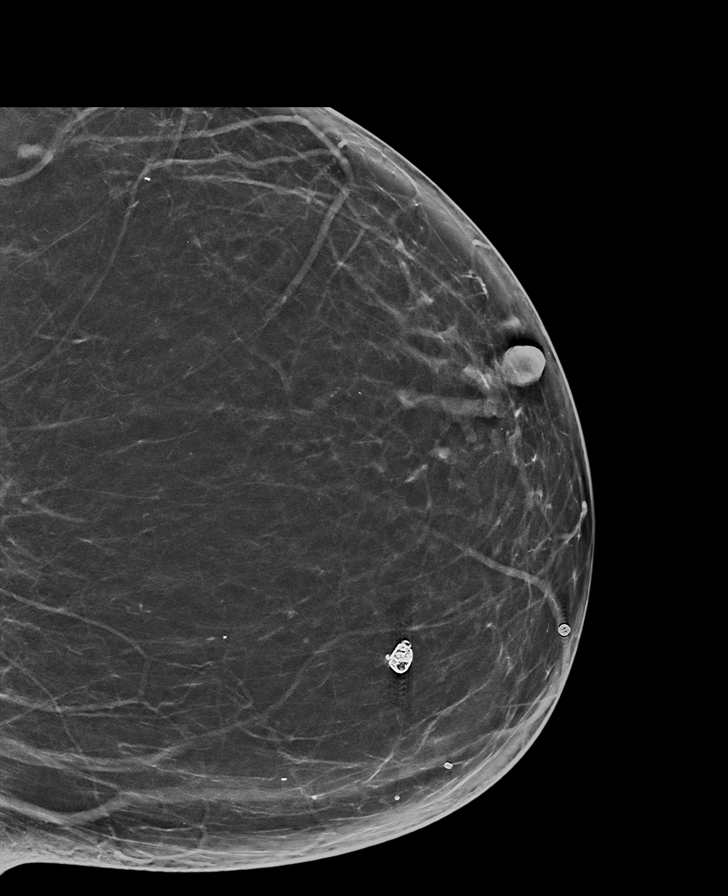

[R CC tomo · tomo slice 42/83.0]
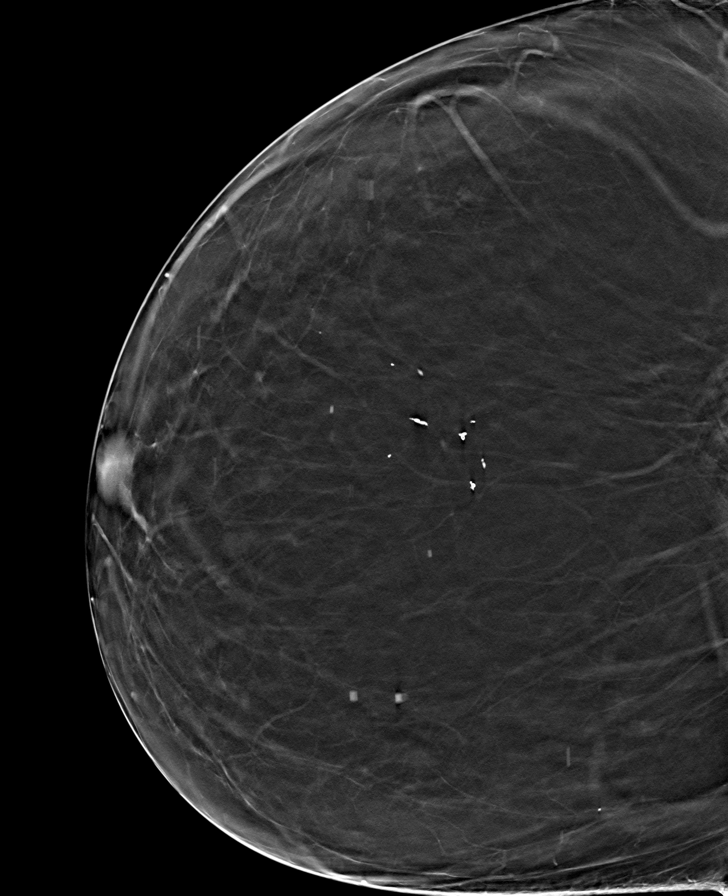

[L CC tomo · tomo slice 43/85.0]
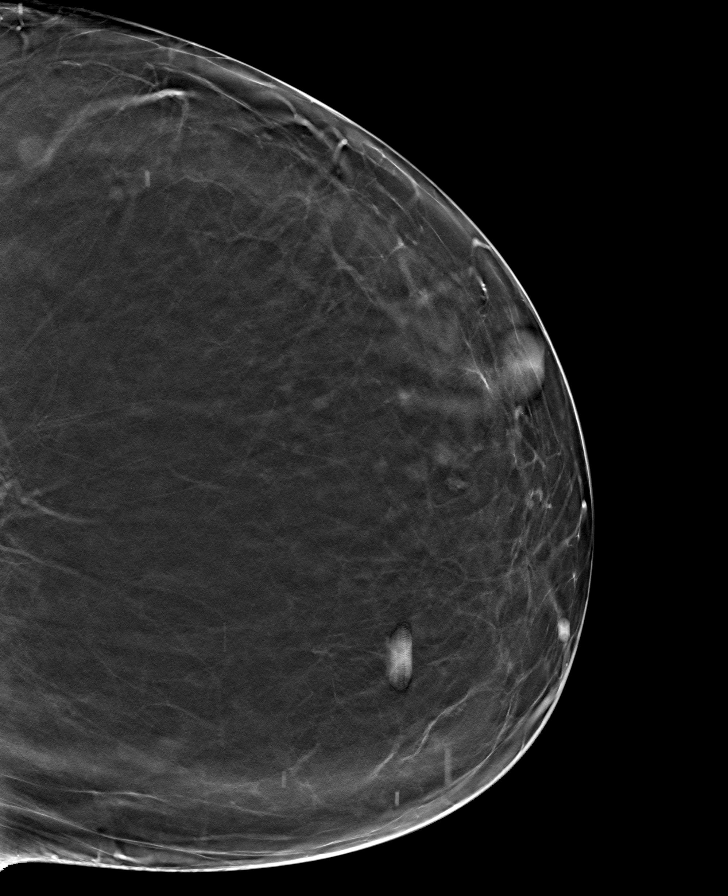

[L MLO tomo · tomo slice 53/106.0]
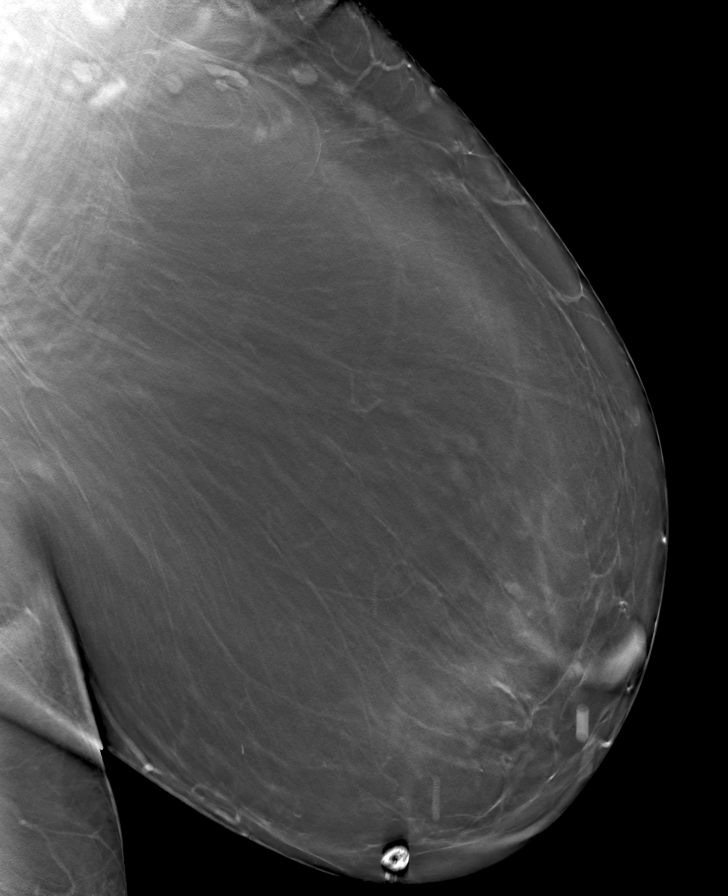

[R MLO tomo · tomo slice 55/108.0]
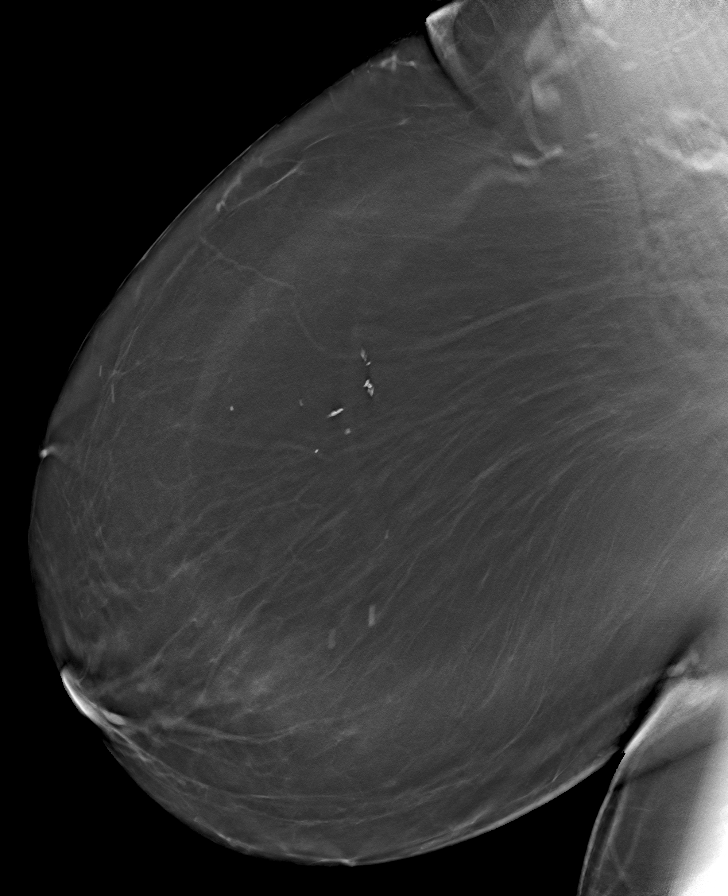

[8 of 24 positions shown; findings below may reference images not displayed]

ACR Breast Density Category b: There are scattered areas of
fibroglandular density.
FINDINGS: There are no findings suspicious for malignancy. Images were
processed with CAD.
IMPRESSION: No mammographic evidence of malignancy. A result letter of this
screening mammogram will be mailed directly to the patient.

RECOMMENDATION:
Screening mammogram in one year. (Code:CN-U-775)

BI-RADS CATEGORY  1: Negative.

## 2020-12-11 ENCOUNTER — Other Ambulatory Visit: Payer: Self-pay | Admitting: "Endocrinology

## 2020-12-23 DIAGNOSIS — Z6839 Body mass index (BMI) 39.0-39.9, adult: Secondary | ICD-10-CM | POA: Diagnosis not present

## 2020-12-23 DIAGNOSIS — E1165 Type 2 diabetes mellitus with hyperglycemia: Secondary | ICD-10-CM | POA: Diagnosis not present

## 2020-12-23 DIAGNOSIS — I10 Essential (primary) hypertension: Secondary | ICD-10-CM | POA: Diagnosis not present

## 2020-12-23 DIAGNOSIS — E7849 Other hyperlipidemia: Secondary | ICD-10-CM | POA: Diagnosis not present

## 2021-03-24 DIAGNOSIS — Z6838 Body mass index (BMI) 38.0-38.9, adult: Secondary | ICD-10-CM | POA: Diagnosis not present

## 2021-03-24 DIAGNOSIS — Z124 Encounter for screening for malignant neoplasm of cervix: Secondary | ICD-10-CM | POA: Diagnosis not present

## 2021-03-24 DIAGNOSIS — I10 Essential (primary) hypertension: Secondary | ICD-10-CM | POA: Diagnosis not present

## 2021-03-24 DIAGNOSIS — E118 Type 2 diabetes mellitus with unspecified complications: Secondary | ICD-10-CM | POA: Diagnosis not present

## 2021-03-24 DIAGNOSIS — E7849 Other hyperlipidemia: Secondary | ICD-10-CM | POA: Diagnosis not present

## 2021-03-24 DIAGNOSIS — Z01411 Encounter for gynecological examination (general) (routine) with abnormal findings: Secondary | ICD-10-CM | POA: Diagnosis not present

## 2021-03-28 ENCOUNTER — Other Ambulatory Visit: Payer: Self-pay | Admitting: Family Medicine

## 2021-03-28 ENCOUNTER — Other Ambulatory Visit (HOSPITAL_COMMUNITY): Payer: Self-pay | Admitting: Family Medicine

## 2021-03-28 DIAGNOSIS — R599 Enlarged lymph nodes, unspecified: Secondary | ICD-10-CM

## 2021-03-30 DIAGNOSIS — Z6838 Body mass index (BMI) 38.0-38.9, adult: Secondary | ICD-10-CM | POA: Diagnosis not present

## 2021-03-30 DIAGNOSIS — E1129 Type 2 diabetes mellitus with other diabetic kidney complication: Secondary | ICD-10-CM | POA: Diagnosis not present

## 2021-03-30 DIAGNOSIS — I10 Essential (primary) hypertension: Secondary | ICD-10-CM | POA: Diagnosis not present

## 2021-04-07 DIAGNOSIS — I10 Essential (primary) hypertension: Secondary | ICD-10-CM | POA: Diagnosis not present

## 2021-04-21 ENCOUNTER — Other Ambulatory Visit (HOSPITAL_COMMUNITY): Payer: Self-pay | Admitting: Family Medicine

## 2021-04-21 ENCOUNTER — Other Ambulatory Visit: Payer: Self-pay | Admitting: Family Medicine

## 2021-04-21 DIAGNOSIS — I10 Essential (primary) hypertension: Secondary | ICD-10-CM

## 2021-04-27 ENCOUNTER — Ambulatory Visit (HOSPITAL_COMMUNITY)
Admission: RE | Admit: 2021-04-27 | Discharge: 2021-04-27 | Disposition: A | Discharge status: Home / Self Care | Payer: BC Managed Care – PPO | Source: Ambulatory Visit | Attending: Family Medicine | Admitting: Family Medicine

## 2021-04-27 ENCOUNTER — Other Ambulatory Visit (HOSPITAL_COMMUNITY): Payer: Self-pay | Admitting: Family Medicine

## 2021-04-27 ENCOUNTER — Other Ambulatory Visit: Payer: Self-pay

## 2021-04-27 DIAGNOSIS — M25561 Pain in right knee: Secondary | ICD-10-CM

## 2021-04-27 DIAGNOSIS — Z6838 Body mass index (BMI) 38.0-38.9, adult: Secondary | ICD-10-CM | POA: Diagnosis not present

## 2021-04-28 ENCOUNTER — Ambulatory Visit (HOSPITAL_COMMUNITY)
Admission: RE | Admit: 2021-04-28 | Discharge: 2021-04-28 | Disposition: A | Discharge status: Home / Self Care | Payer: BC Managed Care – PPO | Source: Ambulatory Visit | Attending: Family Medicine | Admitting: Family Medicine

## 2021-04-28 DIAGNOSIS — I10 Essential (primary) hypertension: Secondary | ICD-10-CM | POA: Diagnosis not present

## 2021-04-28 NOTE — Progress Notes (Signed)
Renal artery duplex has been completed.   Preliminary results in CV Proc.   Abram Sander 04/28/2021 10:29 AM

## 2021-05-09 ENCOUNTER — Encounter: Payer: Self-pay | Admitting: Orthopaedic Surgery

## 2021-05-09 ENCOUNTER — Other Ambulatory Visit: Payer: Self-pay

## 2021-05-09 ENCOUNTER — Ambulatory Visit (INDEPENDENT_AMBULATORY_CARE_PROVIDER_SITE_OTHER): Payer: BC Managed Care – PPO | Admitting: Orthopaedic Surgery

## 2021-05-09 VITALS — BP 179/124 | HR 110 | Ht 64.0 in | Wt 240.5 lb

## 2021-05-09 DIAGNOSIS — M25561 Pain in right knee: Secondary | ICD-10-CM | POA: Diagnosis not present

## 2021-05-09 DIAGNOSIS — G8929 Other chronic pain: Secondary | ICD-10-CM

## 2021-05-09 MED ORDER — NAPROXEN 500 MG PO TABS: 500.0000 mg | ORAL_TABLET | Freq: Two times a day (BID) | ORAL | 5 refills | Status: DC

## 2021-05-09 NOTE — Progress Notes (Signed)
Subjective:    Patient ID: Jillian Adkins, female    DOB: [birth year], 55 y.o.   MRN: [id number]  HPI She has had right knee pain for about 2 to 3 months.  She has medial pain. She has popping and swelling.  She has some giving way at times.  She has good and bad days.  She has been seen at [facility] on June 8.  I have their notes and I have reviewed them.  I have reviewed the X-rays.  I have independently reviewed and interpreted x-rays of this patient done at another site by another physician or qualified health professional.  She has tried Tylenol, rest, ice, and Icy Hot with little help.  She has more problems going down the steps.  She has no weakness, no numbness.  She has no trauma.   Review of Systems  Constitutional:  Positive for activity change.  Musculoskeletal:  Positive for arthralgias, gait problem and joint swelling.  All other systems reviewed and are negative. For Review of Systems, all other systems reviewed and are negative.  The following is a summary of the past history medically, past history surgically, known current medicines, social history and family history.  This information is gathered electronically by the computer from prior information and documentation.  I review this each visit and have found including this information at this point in the chart is beneficial and informative.   Past Medical History:  Diagnosis Date   Diabetes mellitus without complication ([place])    Hypertension     Past Surgical History:  Procedure Laterality Date   CESAREAN SECTION  [phone]   COLONOSCOPY N/A 08/08/2018   Procedure: COLONOSCOPY;  Surgeon: Daneil Dolin, MD;  [facility];  Service: Endoscopy;  Laterality: N/A;  11:30   POLYPECTOMY  08/08/2018   Procedure: POLYPECTOMY;  Surgeon: Daneil Dolin, MD;  [facility]: [facility];  Service: Endoscopy;;   TUBAL LIGATION      Current Outpatient Medications on File Prior to Visit  Medication Sig  Dispense Refill   Cholecalciferol (VITAMIN D3) 125 MCG (5000 UT) CAPS Take 1 capsule (5,000 Units total) by mouth daily. 90 capsule 0   glipiZIDE (GLUCOTROL XL) 5 MG 24 hr tablet TAKE 1 TABLET BY MOUTH EVERY DAY WITH BREAKFAST 90 tablet 1   hydrochlorothiazide (HYDRODIURIL) 25 MG tablet TAKE 1 TABLET BY MOUTH EVERY DAY 90 tablet 0   Insulin Glargine (LANTUS SOLOSTAR) 100 UNIT/ML Solostar Pen Inject 50 Units into the skin at bedtime. 15 mL 4   Insulin Pen Needle (B-D ULTRAFINE III SHORT PEN) 31G X 8 MM MISC 1 each by Does not apply route as directed. 90 each 3   metFORMIN (GLUCOPHAGE) 1000 MG tablet Take 500 mg by mouth 2 (two) times daily with a meal.     spironolactone (ALDACTONE) 50 MG tablet TAKE 1 TABLET BY MOUTH EVERY DAY 90 tablet 0   No current facility-administered medications on file prior to visit.    Social History   Socioeconomic History   Marital status: Married    Spouse name: Not on file   Number of children: Not on file   Years of education: Not on file   Highest education level: Not on file  Occupational History   Not on file  Tobacco Use   Smoking status: Never   Smokeless tobacco: Never  Vaping Use   Vaping Use: Never used  Substance and Sexual Activity   Alcohol use: No  Drug use: No   Sexual activity: Yes    Birth control/protection: Surgical    Comment: tubal  Other Topics Concern   Not on file  Social History Narrative   Not on file   Social Determinants of Health   Financial Resource Strain: Not on file  Food Insecurity: Not on file  Transportation Needs: Not on file  Physical Activity: Not on file  Stress: Not on file  Social Connections: Not on file  Intimate Partner Violence: Not on file    Family History  Problem Relation Age of Onset   Hypertension Mother    Diabetes Mother    Colon cancer Neg Hx     BP (!) 179/124   Pulse (!) 110   Ht 5\' 4"  (1.626 m)   Wt 240 lb 8 oz (109.1 kg)   LMP 10/23/2015   BMI 41.28 kg/m   Body mass  index is 41.28 kg/m.     Objective:   Physical Exam Vitals and nursing note reviewed. Exam conducted with a chaperone present.  Constitutional:      Appearance: She is well-developed.  HENT:     Head: Normocephalic and atraumatic.  Eyes:     Conjunctiva/sclera: Conjunctivae normal.     Pupils: Pupils are equal, round, and reactive to light.  Cardiovascular:     Rate and Rhythm: Normal rate and regular rhythm.  Pulmonary:     Effort: Pulmonary effort is normal.  Abdominal:     Palpations: Abdomen is soft.  Musculoskeletal:     Cervical back: Normal range of motion and neck supple.       Legs:  Skin:    General: Skin is warm and dry.  Neurological:     Mental Status: She is alert and oriented to person, place, and time.     Cranial Nerves: No cranial nerve deficit.     Motor: No abnormal muscle tone.     Coordination: Coordination normal.     Deep Tendon Reflexes: Reflexes are normal and symmetric. Reflexes normal.  Psychiatric:        Behavior: Behavior normal.        Thought Content: Thought content normal.        Judgment: Judgment normal.          Assessment & Plan:   Encounter Diagnosis  Name Primary?   Chronic pain of right knee Yes   I am concerned about medial meniscus tear.  She may need MRI.  I have given sheet of exercises to do.  I will begin Naprosyn  po bid pc.  Return in one month.  Consider MRI if still with symptoms.  Call if any problem.  Precautions discussed.  Electronically Signed Sanjuana Kava, MD 6/21/20228:27 AM

## 2021-06-06 ENCOUNTER — Other Ambulatory Visit: Payer: Self-pay

## 2021-06-06 ENCOUNTER — Encounter: Payer: Self-pay | Admitting: Orthopaedic Surgery

## 2021-06-06 ENCOUNTER — Ambulatory Visit (INDEPENDENT_AMBULATORY_CARE_PROVIDER_SITE_OTHER): Payer: BC Managed Care – PPO | Admitting: Orthopaedic Surgery

## 2021-06-06 VITALS — BP 161/107 | HR 94 | Ht 64.0 in | Wt 246.6 lb

## 2021-06-06 DIAGNOSIS — M25561 Pain in right knee: Secondary | ICD-10-CM | POA: Diagnosis not present

## 2021-06-06 DIAGNOSIS — G8929 Other chronic pain: Secondary | ICD-10-CM | POA: Diagnosis not present

## 2021-06-06 NOTE — Progress Notes (Signed)
My knee is better.  Her right knee has less pain but it is still there at times.  She has been on the Naprosyn and it has helped. She has no GI issues with the medicine.  She has no giving way, she has no new injury.  She has some swelling still.  Right knee has good ROM of 0 to 115, stable, slight effusion, slight crepitus, NV intact.  No limp.  Encounter Diagnosis  Name Primary?   Chronic pain of right knee Yes   Continue the Naprosyn.  Return in one month. Call and cancel if doing well.  Call if any problem.  Precautions discussed.  Electronically Signed Sanjuana Kava, MD 7/19/20228:24 AM

## 2021-07-04 ENCOUNTER — Encounter: Payer: Self-pay | Admitting: Orthopaedic Surgery

## 2021-07-04 ENCOUNTER — Ambulatory Visit (INDEPENDENT_AMBULATORY_CARE_PROVIDER_SITE_OTHER): Payer: 59 | Admitting: Orthopaedic Surgery

## 2021-07-04 ENCOUNTER — Ambulatory Visit: Payer: 59

## 2021-07-04 ENCOUNTER — Other Ambulatory Visit: Payer: Self-pay

## 2021-07-04 VITALS — BP 171/106 | HR 94 | Ht 64.0 in | Wt 244.4 lb

## 2021-07-04 DIAGNOSIS — M25561 Pain in right knee: Secondary | ICD-10-CM | POA: Diagnosis not present

## 2021-07-04 DIAGNOSIS — M25512 Pain in left shoulder: Secondary | ICD-10-CM

## 2021-07-04 DIAGNOSIS — G8929 Other chronic pain: Secondary | ICD-10-CM | POA: Diagnosis not present

## 2021-07-04 NOTE — Patient Instructions (Addendum)
Use Aspercreme, Biofreeze or Voltaren gel over the counter 2-3 times daily make sure you rub it in well each time you use it.  Shoulder Exercises Ask your health care provider which exercises are safe for you. Do exercises exactly as told by your health care provider and adjust them as directed. It is normal to feel mild stretching, pulling, tightness, or discomfort as you do these exercises. Stop right away if you feel sudden pain or your pain gets worse. Do not begin these exercises until told by your health care provider. Stretching exercises External rotation and abduction This exercise is sometimes called corner stretch. This exercise rotates your arm outward (external rotation) and moves your arm out from your body (abduction). Stand in a doorway with one of your feet slightly in front of the other. This is called a staggered stance. If you cannot reach your forearms to the door frame, stand facing a corner of a room. Choose one of the following positions as told by your health care provider: Place your hands and forearms on the door frame above your head. Place your hands and forearms on the door frame at the height of your head. Place your hands on the door frame at the height of your elbows. Slowly move your weight onto your front foot until you feel a stretch across your chest and in the front of your shoulders. Keep your head and chest upright and keep your abdominal muscles tight. Hold for __________ seconds. To release the stretch, shift your weight to your back foot. Repeat __________ times. Complete this exercise __________ times a day. Extension, standing Stand and hold a broomstick, a cane, or a similar object behind your back. Your hands should be a little wider than shoulder width apart. Your palms should face away from your back. Keeping your elbows straight and your shoulder muscles relaxed, move the stick away from your body until you feel a stretch in your shoulders  (extension). Avoid shrugging your shoulders while you move the stick. Keep your shoulder blades tucked down toward the middle of your back. Hold for __________ seconds. Slowly return to the starting position. Repeat __________ times. Complete this exercise __________ times a day. Range-of-motion exercises Pendulum  Stand near a wall or a surface that you can hold onto for balance. Bend at the waist and let your left / right arm hang straight down. Use your other arm to support you. Keep your back straight and do not lock your knees. Relax your left / right arm and shoulder muscles, and move your hips and your trunk so your left / right arm swings freely. Your arm should swing because of the motion of your body, not because you are using your arm or shoulder muscles. Keep moving your hips and trunk so your arm swings in the following directions, as told by your health care provider: Side to side. Forward and backward. In clockwise and counterclockwise circles. Continue each motion for __________ seconds, or for as long as told by your health care provider. Slowly return to the starting position. Repeat __________ times. Complete this exercise __________ times a day. Shoulder flexion, standing  Stand and hold a broomstick, a cane, or a similar object. Place your hands a little more than shoulder width apart on the object. Your left / right hand should be palm up, and your other hand should be palm down. Keep your elbow straight and your shoulder muscles relaxed. Push the stick up with your healthy arm to raise  your left / right arm in front of your body, and then over your head until you feel a stretch in your shoulder (flexion). Avoid shrugging your shoulder while you raise your arm. Keep your shoulder blade tucked down toward the middle of your back. Hold for __________ seconds. Slowly return to the starting position. Repeat __________ times. Complete this exercise __________ times a  day. Shoulder abduction, standing Stand and hold a broomstick, a cane, or a similar object. Place your hands a little more than shoulder width apart on the object. Your left / right hand should be palm up, and your other hand should be palm down. Keep your elbow straight and your shoulder muscles relaxed. Push the object across your body toward your left / right side. Raise your left / right arm to the side of your body (abduction) until you feel a stretch in your shoulder. Do not raise your arm above shoulder height unless your health care provider tells you to do that. If directed, raise your arm over your head. Avoid shrugging your shoulder while you raise your arm. Keep your shoulder blade tucked down toward the middle of your back. Hold for __________ seconds. Slowly return to the starting position. Repeat __________ times. Complete this exercise __________ times a day. Internal rotation  Place your left / right hand behind your back, palm up. Use your other hand to dangle an exercise band, a towel, or a similar object over your shoulder. Grasp the band with your left / right hand so you are holding on to both ends. Gently pull up on the band until you feel a stretch in the front of your left / right shoulder. The movement of your arm toward the center of your body is called internal rotation. Avoid shrugging your shoulder while you raise your arm. Keep your shoulder blade tucked down toward the middle of your back. Hold for __________ seconds. Release the stretch by letting go of the band and lowering your hands. Repeat __________ times. Complete this exercise __________ times a day. Strengthening exercises External rotation  Sit in a stable chair without armrests. Secure an exercise band to a stable object at elbow height on your left / right side. Place a soft object, such as a folded towel or a small pillow, between your left / right upper arm and your body to move your elbow about 4  inches (10 cm) away from your side. Hold the end of the exercise band so it is tight and there is no slack. Keeping your elbow pressed against the soft object, slowly move your forearm out, away from your abdomen (external rotation). Keep your body steady so only your forearm moves. Hold for __________ seconds. Slowly return to the starting position. Repeat __________ times. Complete this exercise __________ times a day. Shoulder abduction  Sit in a stable chair without armrests, or stand up. Hold a __________ weight in your left / right hand, or hold an exercise band with both hands. Start with your arms straight down and your left / right palm facing in, toward your body. Slowly lift your left / right hand out to your side (abduction). Do not lift your hand above shoulder height unless your health care provider tells you that this is safe. Keep your arms straight. Avoid shrugging your shoulder while you do this movement. Keep your shoulder blade tucked down toward the middle of your back. Hold for __________ seconds. Slowly lower your arm, and return to the starting position. Repeat  __________ times. Complete this exercise __________ times a day. Shoulder extension Sit in a stable chair without armrests, or stand up. Secure an exercise band to a stable object in front of you so it is at shoulder height. Hold one end of the exercise band in each hand. Your palms should face each other. Straighten your elbows and lift your hands up to shoulder height. Step back, away from the secured end of the exercise band, until the band is tight and there is no slack. Squeeze your shoulder blades together as you pull your hands down to the sides of your thighs (extension). Stop when your hands are straight down by your sides. Do not let your hands go behind your body. Hold for __________ seconds. Slowly return to the starting position. Repeat __________ times. Complete this exercise __________ times a  day. Shoulder row Sit in a stable chair without armrests, or stand up. Secure an exercise band to a stable object in front of you so it is at waist height. Hold one end of the exercise band in each hand. Position your palms so that your thumbs are facing the ceiling (neutral position). Bend each of your elbows to a 90-degree angle (right angle) and keep your upper arms at your sides. Step back until the band is tight and there is no slack. Slowly pull your elbows back behind you. Hold for __________ seconds. Slowly return to the starting position. Repeat __________ times. Complete this exercise __________ times a day. Shoulder press-ups  Sit in a stable chair that has armrests. Sit upright, with your feet flat on the floor. Put your hands on the armrests so your elbows are bent and your fingers are pointing forward. Your hands should be about even with the sides of your body. Push down on the armrests and use your arms to lift yourself off the chair. Straighten your elbows and lift yourself up as much as you comfortably can. Move your shoulder blades down, and avoid letting your shoulders move up toward your ears. Keep your feet on the ground. As you get stronger, your feet should support less of your body weight as you lift yourself up. Hold for __________ seconds. Slowly lower yourself back into the chair. Repeat __________ times. Complete this exercise __________ times a day. Wall push-ups  Stand so you are facing a stable wall. Your feet should be about one arm-length away from the wall. Lean forward and place your palms on the wall at shoulder height. Keep your feet flat on the floor as you bend your elbows and lean forward toward the wall. Hold for __________ seconds. Straighten your elbows to push yourself back to the starting position. Repeat __________ times. Complete this exercise __________ times a day. This information is not intended to replace advice given to you by your  health care provider. Make sure you discuss any questions you have with your healthcare provider. Document Revised: 02/27/2019 Document Reviewed: 12/05/2018 Else[employer] Patient Education  [place].

## 2021-07-04 NOTE — Progress Notes (Signed)
My knee is better but my left shoulder hurts.  Her right knee is improved with the Naprosyn.  It still has some swelling but it is less and no giving way.  She has had pain in the left shoulder for over three weeks with pain raising her hand over head or sleeping on it. She has no trauma. She has no swelling or numbness.  She saw her primary care about this and was told to see me.  ROM of the right knee is 0 to 110, slight crepitus, slight effusion, stable, no limp.  ROM of the left shoulder is painful in the extremes and with resisted abduction.  NV intact.  No effusion.  Encounter Diagnoses  Name Primary?   Chronic pain in left shoulder Yes   Chronic pain of right knee    X-rays were done of the left shoulder, reported separately.  PROCEDURE NOTE:  The patient request injection, verbal consent was obtained.  The left shoulder was prepped appropriately after time out was performed.   Sterile technique was observed and injection of 1 cc of Celestone 6 mg with several cc's of plain xylocaine. Anesthesia was provided by ethyl chloride and a 20-gauge needle was used to inject the shoulder area. A posterior approach was used.  The injection was tolerated well.  A band aid dressing was applied.  The patient was advised to apply ice later today and tomorrow to the injection sight as needed.   Continue the Naprosyn.  Return in three weeks.  She may need MRI of the left shoulder.  Sheet of exercises given for her shoulder.  Call if any problem.  Precautions discussed.  Electronically Signed Sanjuana Kava, MD 8/16/20229:07 AM

## 2021-07-27 ENCOUNTER — Ambulatory Visit (INDEPENDENT_AMBULATORY_CARE_PROVIDER_SITE_OTHER): Payer: 59 | Admitting: Orthopaedic Surgery

## 2021-07-27 ENCOUNTER — Other Ambulatory Visit: Payer: Self-pay

## 2021-07-27 ENCOUNTER — Encounter: Payer: Self-pay | Admitting: Orthopaedic Surgery

## 2021-07-27 VITALS — BP 182/115 | HR 91 | Ht 64.0 in | Wt 243.0 lb

## 2021-07-27 DIAGNOSIS — M25561 Pain in right knee: Secondary | ICD-10-CM

## 2021-07-27 DIAGNOSIS — M25512 Pain in left shoulder: Secondary | ICD-10-CM | POA: Diagnosis not present

## 2021-07-27 DIAGNOSIS — G8929 Other chronic pain: Secondary | ICD-10-CM

## 2021-07-27 NOTE — Progress Notes (Signed)
My knee is much better, my shoulder is still hurting.  Her right knee is much improved after the injection last time.  The left shoulder is tender but has more ROM and less pain but it still hurts and pops at times.  She has no new trauma, no numbness.  She is taking her Naprosyn regularly.  ROM of the right knee is 0 to 110, no limp, slight effusion, slight crepitus, stable.  ROM of the left shoulder is full but tender in the extremes.  NV intact.  Encounter Diagnoses  Name Primary?   Chronic pain in left shoulder Yes   Chronic pain of right knee    Continue the Naprosyn.  Return in one month.  Call if any problem.  Precautions discussed.  Electronically Signed Sanjuana Kava, MD 9/8/20228:58 AM

## 2021-08-22 ENCOUNTER — Ambulatory Visit: Payer: 59 | Admitting: Cardiovascular Disease

## 2021-08-24 ENCOUNTER — Encounter: Payer: Self-pay | Admitting: Orthopaedic Surgery

## 2021-08-24 ENCOUNTER — Ambulatory Visit (INDEPENDENT_AMBULATORY_CARE_PROVIDER_SITE_OTHER): Payer: 59 | Admitting: Orthopaedic Surgery

## 2021-08-24 ENCOUNTER — Other Ambulatory Visit: Payer: Self-pay

## 2021-08-24 VITALS — BP 168/116 | HR 90 | Ht 64.0 in | Wt 245.0 lb

## 2021-08-24 DIAGNOSIS — G8929 Other chronic pain: Secondary | ICD-10-CM

## 2021-08-24 DIAGNOSIS — M25512 Pain in left shoulder: Secondary | ICD-10-CM | POA: Diagnosis not present

## 2021-08-24 NOTE — Progress Notes (Signed)
PROCEDURE NOTE:  The patient request injection, verbal consent was obtained.  The left shoulder was prepped appropriately after time out was performed.   Sterile technique was observed and injection of 1 cc of Celestone 6 mg with several cc's of plain xylocaine. Anesthesia was provided by ethyl chloride and a 20-gauge needle was used to inject the shoulder area. A posterior approach was used.  The injection was tolerated well.  A band aid dressing was applied.  The patient was advised to apply ice later today and tomorrow to the injection sight as needed.  Return in one month.  Continue the Naprosyn.  Encounter Diagnosis  Name Primary?   Chronic pain in left shoulder Yes   Call if any problem.  Precautions discussed.  Electronically Signed Sanjuana Kava, MD 10/6/20228:14 AM

## 2021-09-03 NOTE — Progress Notes (Signed)
Cardiology Office Note:   Date:  09/04/2021  NAME:  Jillian Adkins    MRN: [id number] DOB:  1966/02/27   PCP:  Jake Samples, PA-C  Cardiologist:  None  Electrophysiologist:  None   Referring MD: Jake Samples, PA*   Chief Complaint  Patient presents with   Abnormal ECG   History of Present Illness:   Jillian Adkins is a 55 y.o. female with a hx of HTN, DM, obesity who is being seen today for the evaluation of abnormal EKG at the request of Jake Samples, PA-C.  She had rather abrupt change in blood pressure.  Roughly 3 months ago blood pressure did elevate.  She is now on 5 medications.  Medications include amlodipine 10 mg daily, clonidine patch 0.3 mg once a week.  Hydralazine 25 mg 3 times daily.  Valsartan 320 mg and HCTZ 25 mg daily.  She reports she took her blood pressure medications today.  Her blood pressure is 158/96.  She denies any chest pain or trouble breathing.  She does not exercise.  Diet appears to be high in salt as well as processed foods.  She reports she does not know if she snores.  No sleep apnea reported.  She is obese with a BMI of 42.  She is diabetic but it is well controlled.  Recent lab work demonstrates normal kidney function with serum creatinine 1.08.  Most recent A1c 6.9.  Her most recent TSH from last year was 1.16.  She reports no significant stress in her life.  She is not doing anything different over the past few months.  She is postmenopausal for several years.  We did discuss that diet and lack of exercise could be contributing.  Sleep apnea not a major issue.  Hypertension is present in her mother.  No strong family history of heart disease.  She has never had a heart attack or stroke.  She does not smoke, drink alcohol or use drugs.  She works as a Emergency planning/management officer.  She has 3 children and 1 grandchild.  Overall denies any major symptoms in office today.  Problem List DM -A1c 6.9 HTN HLD -T chol 171, HDL 42, LDL 111, TG 98  Past  Medical History: Past Medical History:  Diagnosis Date   Diabetes mellitus without complication ([place])    Hypertension     Past Surgical History: Past Surgical History:  Procedure Laterality Date   CESAREAN SECTION  1998,2000,2002   COLONOSCOPY N/A 08/08/2018   Procedure: COLONOSCOPY;  Surgeon: Daneil Dolin, MD;  Location: [facility];  Service: Endoscopy;  Laterality: N/A;  11:30   POLYPECTOMY  08/08/2018   Procedure: POLYPECTOMY;  Surgeon: Daneil Dolin, MD;  Location: [facility];  Service: Endoscopy;;   TUBAL LIGATION      Current Medications: Current Meds  Medication Sig   amLODipine (NORVASC) 10 MG tablet Take 10 mg by mouth daily.   Cholecalciferol (VITAMIN D3) 125 MCG (5000 UT) CAPS Take 1 capsule (5,000 Units total) by mouth daily.   Insulin Glargine (LANTUS SOLOSTAR) 100 UNIT/ML Solostar Pen Inject 50 Units into the skin at bedtime. (Patient taking differently: Inject 15 Units into the skin at bedtime.)   Insulin Pen Needle (B-D ULTRAFINE III SHORT PEN) 31G X 8 MM MISC 1 each by Does not apply route as directed.   metFORMIN (GLUCOPHAGE) 1000 MG tablet Take 500 mg by mouth 2 (two) times daily with a meal.   naproxen (NAPROSYN)  500 MG tablet Take 1 tablet (500 mg total) by mouth 2 (two) times daily with a meal.   OVER THE COUNTER MEDICATION Take 1 Dose by mouth daily. Potassium 650 mg per patient   spironolactone (ALDACTONE) 25 MG tablet Take 1 tablet (25 mg total) by mouth daily.   TRULICITY 3 HD/6.2IW SOPN Inject 3 mg into the skin once a week.   valsartan-hydrochlorothiazide (DIOVAN-HCT) 320-25 MG tablet Take 1 tablet by mouth daily.   [DISCONTINUED] cloNIDine (CATAPRES - DOSED IN MG/24 HR) 0.3 mg/24hr patch Place 0.3 mg onto the skin once a week.   [DISCONTINUED] hydrALAZINE (APRESOLINE) 25 MG tablet Take 25 mg by mouth 3 (three) times daily.     Allergies:    Patient has no known allergies.   Social History: Social History   Socioeconomic History    Marital status: Married    Spouse name: Not on file   Number of children: 3   Years of education: Not on file   Highest education level: Not on file  Occupational History   Occupation: Nurse  Tobacco Use   Smoking status: Never   Smokeless tobacco: Never  Vaping Use   Vaping Use: Never used  Substance and Sexual Activity   Alcohol use: No   Drug use: No   Sexual activity: Yes    Birth control/protection: Surgical    Comment: tubal  Other Topics Concern   Not on file  Social History Narrative   Not on file   Social Determinants of Health   Financial Resource Strain: Not on file  Food Insecurity: Not on file  Transportation Needs: Not on file  Physical Activity: Not on file  Stress: Not on file  Social Connections: Not on file     Family History: The patient's family history includes Diabetes in her mother; Hypertension in her mother. There is no history of Colon cancer.  ROS:   All other ROS reviewed and negative. Pertinent positives noted in the HPI.     EKGs/Labs/Other Studies Reviewed:   The following studies were personally reviewed by me today:  EKG:  EKG is ordered today.  The ekg ordered today demonstrates normal sinus rhythm heart rate 84, LVH by voltage, and was personally reviewed by me.   Recent Labs: No results found for requested labs within last 8760 hours.   Recent Lipid Panel    Component Value Date/Time   CHOL 142 02/23/2017 0852   TRIG 66 02/23/2017 0852   HDL 36 (L) 02/23/2017 0852   CHOLHDL 3.9 02/23/2017 0852   VLDL 13 02/23/2017 0852   LDLCALC 93 02/23/2017 0852    Physical Exam:   VS:  BP (!) 158/96   Pulse 84   Ht 5' 4"  (1.626 m)   Wt 247 lb (112 kg)   LMP 10/23/2015   SpO2 99%   BMI 42.40 kg/m    Wt Readings from Last 3 Encounters:  09/04/21 247 lb (112 kg)  08/24/21 245 lb (111.1 kg)  07/27/21 243 lb (110.2 kg)    General: Well nourished, well developed, in no acute distress Head: Atraumatic, normal size  Eyes: PEERLA,  EOMI  Neck: Supple, no JVD Endocrine: No thryomegaly Cardiac: Normal S1, S2; RRR; no murmurs, rubs, or gallops Lungs: Clear to auscultation bilaterally, no wheezing, rhonchi or rales  Abd: Soft, nontender, no hepatomegaly  Ext: No edema, pulses 2+ Musculoskeletal: No deformities, BUE and BLE strength normal and equal Skin: Warm and dry, no rashes   Neuro: Alert and  oriented to person, place, time, and situation, CNII-XII grossly intact, no focal deficits  Psych: Normal mood and affect   ASSESSMENT:   Jillian Adkins is a 55 y.o. female who presents for the following: 1. Nonspecific abnormal electrocardiogram (ECG) (EKG)   2. Primary hypertension   3. Obesity, morbid, BMI 40.0-49.9 ([place])     PLAN:   1. Nonspecific abnormal electrocardiogram (ECG) (EKG) 2. Primary hypertension 3. Obesity, morbid, BMI 40.0-49.9 ([facility]) -EKG demonstrates LVH by voltage.  She has had a rather abrupt change in blood pressure.  Unclear what is changed.  Risk factors for hypertension include morbid obesity, inactivity and high salty meals.  She was given information on salt reductive strategies.  She needs to start exercising.  No concerns for sleep apnea.  I hear no abdominal bruits.  Cardiovascular examination really unremarkable.  I believe diet and obesity are contributing a lot to her hypertension.  Also in activity.  I want her to start exercising 20 minutes daily.  This should include walking.  We also gave her information about reducing her salt intake.  She will keep an eye out for sleep apnea.  No concerns reported today.  I think if she could lose some weight this would also help with her blood pressure values.  In the meantime we will continue amlodipine 10 mg daily.  Continue valsartan-HCTZ 320-25 mg daily.  I would like to stop her hydralazine and clonidine.  Like to put her on Aldactone 25 mg daily.  I want her to keep a blood pressure of her log twice a day.  If blood pressure remains elevated after these  changes and lifestyle modifications we may need to consider echocardiogram as well as screening for possible other secondary causes.  She really has no risk factors for renal artery stenosis.  If she does have hyperaldosteronism Aldactone would treat it.  We will see how she does with more intense lifestyle modifications first.  Disposition: Return in about 4 months (around 01/05/2022).  Medication Adjustments/Labs and Tests Ordered: Current medicines are reviewed at length with the patient today.  Concerns regarding medicines are outlined above.  Orders Placed This Encounter  Procedures   EKG 12-Lead   Meds ordered this encounter  Medications   spironolactone (ALDACTONE) 25 MG tablet    Sig: Take 1 tablet (25 mg total) by mouth daily.    Dispense:  90 tablet    Refill:  3    Patient Instructions  Medication Instructions:  Your physician has recommended you make the following change in your medication:  STOP Hydralazine STOP Clonidine  START Aldactone 25 mg tablets daily  *If you need a refill on your cardiac medications before your next appointment, please call your pharmacy*   Lab Work: None If you have labs (blood work) drawn today and your tests are completely normal, you will receive your results only by: [place] (if you have MyChart) OR A paper copy in the mail If you have any lab test that is abnormal or we need to change your treatment, we will call you to review the results.   Testing/Procedures: None   Follow-Up: At [facility], you and your health needs are our priority.  As part of our continuing mission to provide you with exceptional heart care, we have created designated Provider Care Teams.  These Care Teams include your primary Cardiologist (physician) and Advanced Practice Providers (APPs -  Physician Assistants and Nurse Practitioners) who all work together to provide you with  the care you need, when you need it.  We recommend signing up for the  patient portal called "MyChart".  Sign up information is provided on this After Visit Summary.  MyChart is used to connect with patients for Virtual Visits (Telemedicine).  Patients are able to view lab/test results, encounter notes, upcoming appointments, etc.  Non-urgent messages can be sent to your provider as well.   To learn more about what you can do with MyChart, go to [email or web address].    Your next appointment:   4 month(s)  The format for your next appointment:   In Person  Provider:   Eleonore Chiquito, MD   Other Instructions Exercise Information  Staying physically active is important as you age. The four types of exercises that are best for older adults are endurance, strength, balance, and flexibility. Contact your health care provider before you start any exercise routine. Ask your health care provider what activities are safe for you. What are the risks? Risks associated with exercising include: Overdoing it. This may lead to sore muscles or fatigue. Falls. Injuries. Dehydration. How to do these exercises Endurance exercises Endurance (aerobic) exercises raise your breathing rate and heart rate. Increasing your endurance helps you to do everyday tasks and stay healthy. By improving the health of your body system that includes your heart, lungs, and blood vessels (circulatory system), you may also delay or prevent diseases such as heart disease, diabetes, and bone loss (osteoporosis). Types of endurance exercises include: Sports. Indoor activities, such as using gym equipment, doing water aerobics, or dancing. Outdoor activities, such as biking or jogging. Tasks around the house, such as gardening, yard work, and heavy household chores like cleaning. Walking, such as hiking or walking around your neighborhood. When doing endurance exercises, make sure you: Are aware of your surroundings. Use safety equipment as directed. Dress in layers when exercising  outdoors. Drink plenty of water to stay well hydrated. Build up endurance slowly. Start with 10 minutes at a time, and gradually build up to doing 30 minutes at a time. Unless your health care provider gave you different instructions, aim to exercise for a total of 150 minutes a week. Spread out that time so you are working on endurance on 3 or more days a week. Strength exercises Lifting, pulling, or pushing weights helps to strengthen muscles. Having stronger muscles makes it easier to do everyday activities, such as getting up from a chair, climbing stairs, carrying groceries, and playing with grandchildren. Strength exercises include arm and leg exercises that may be done: With weights. Without weights (using your own body weight). With a resistance band. When doing strength exercises: Move smoothly and steadily. Do not suddenly thrust or jerk the weights, the resistance band, or your body. Start with no weights or with light weights, and gradually add more weight over time. Eventually, aim to use weights that are hard or very hard for you to lift. This means that you are able to do 8 repetitions with the weight, and the last few repetitions are very challenging. Lift or push weights into position for 3 seconds, hold the position for 1 second, and then take 3 seconds to return to your starting position. Breathe out (exhale) during difficult movements, like lifting or pushing weights. Breathe in (inhale) to relax your muscles before the next repetition. Consider alternating arms or legs, especially when you first start strength exercises. Expect some slight muscle soreness after each session. Do strength exercises on 2 or more days a week,  for 30 minutes at a time. Avoid exercising the same muscle groups two days in a row. For example, if you work on your leg muscles one day, work on your arm muscles the next day. When you can do two sets of 10-15 repetitions with a certain weight, increase the  amount of weight. Balance Balance exercises can help to prevent falls. Balance exercises include: Standing on one foot. Heel-to-toe walk. Balance walk. Tai chi. Make sure you have something sturdy to hold onto while doing balance exercises, such as a sturdy chair. As your balance improves, challenge yourself by holding onto the chair with one hand instead of two, and then with no hands. Trying exercises with your eyes closed also challenges your balance, but be sure to have a sturdy surface (like a countertop) close by in case you need it. Do balance exercises as often as you want, or as often as directed by your health care provider. Strength exercises for the lower body also help to improve balance. Flexibility Flexibility exercises improve how far you can bend, straighten, move, or rotate parts of your body (range of motion). These exercises also help you to do everyday activities such as getting dressed or reaching for objects. Flexibility exercises include stretching different parts of the body, and they may be done in a standing or seated position or on the floor. When stretching, make sure you: Keep a slight bend in your arms and legs. Avoid completely straightening ("locking") your joints. Do not stretch so far that you feel pain. You should feel a mild stretching feeling. You may try stretching farther as you become more flexible over time. Relax and breathe between stretches. Hold onto something sturdy for balance as needed. Hold each stretch for 10-30 seconds. Repeat each stretch 3-5 times. General safety tips Exercise in well-lit areas. Do not hold your breath during exercises or stretches. Warm up before exercising, and cool down after exercising. This can help prevent injury. Drink plenty of water during exercise or any activity that makes you sweat. Use smooth, steady movements. Do not use sudden, jerking movements, especially when lifting weights or doing flexibility  exercises. If you are not sure if an exercise is safe for you, or you are not sure how to do an exercise, talk with your health care provider. This is especially important if you have had surgery on muscles, bones, or joints (orthopedic surgery). Where to find more information You can find more information about exercise for older adults from: Your local health department, fitness center, or community center. These facilities may have programs for aging adults. [employer] on Aging: http://kim-miller.com/ National Council on Aging: www.ncoa.org Summary Staying physically active is important as you age. Make sure to contact your health care provider before you start any exercise routine. Ask your health care provider what activities are safe for you. Doing endurance, strength, balance, and flexibility exercises can help to delay or prevent certain diseases, such as heart disease, diabetes, and bone loss (osteoporosis). This information is not intended to replace advice given to you by your health care provider. Make sure you discuss any questions you have with your health care provider. Document Revised: 02/24/2020 Document Reviewed: 02/24/2020 Elsevier Patient Education  2022 [place], [place] T. Audie Box, MD, [place]  [address] [place], [place] [address] [phone]  09/04/2021 2:52 PM

## 2021-09-04 ENCOUNTER — Other Ambulatory Visit: Payer: Self-pay

## 2021-09-04 ENCOUNTER — Ambulatory Visit (INDEPENDENT_AMBULATORY_CARE_PROVIDER_SITE_OTHER): Payer: 59 | Admitting: Cardiovascular Disease

## 2021-09-04 ENCOUNTER — Encounter: Payer: Self-pay | Admitting: Cardiovascular Disease

## 2021-09-04 VITALS — BP 158/96 | HR 84 | Ht 64.0 in | Wt 247.0 lb

## 2021-09-04 DIAGNOSIS — I10 Essential (primary) hypertension: Secondary | ICD-10-CM

## 2021-09-04 DIAGNOSIS — R9431 Abnormal electrocardiogram [ECG] [EKG]: Secondary | ICD-10-CM

## 2021-09-04 MED ORDER — SPIRONOLACTONE 25 MG PO TABS: 25.0000 mg | ORAL_TABLET | Freq: Every day | ORAL | 3 refills | Status: DC

## 2021-09-04 NOTE — Patient Instructions (Signed)
Medication Instructions:  Your physician has recommended you make the following change in your medication:  STOP Hydralazine STOP Clonidine  START Aldactone 25 mg tablets daily  *If you need a refill on your cardiac medications before your next appointment, please call your pharmacy*   Lab Work: None If you have labs (blood work) drawn today and your tests are completely normal, you will receive your results only by: [place] (if you have MyChart) OR A paper copy in the mail If you have any lab test that is abnormal or we need to change your treatment, we will call you to review the results.   Testing/Procedures: None   Follow-Up: At [facility], you and your health needs are our priority.  As part of our continuing mission to provide you with exceptional heart care, we have created designated Provider Care Teams.  These Care Teams include your primary Cardiologist (physician) and Advanced Practice Providers (APPs -  Physician Assistants and Nurse Practitioners) who all work together to provide you with the care you need, when you need it.  We recommend signing up for the patient portal called "MyChart".  Sign up information is provided on this After Visit Summary.  MyChart is used to connect with patients for Virtual Visits (Telemedicine).  Patients are able to view lab/test results, encounter notes, upcoming appointments, etc.  Non-urgent messages can be sent to your provider as well.   To learn more about what you can do with MyChart, go to [email or web address].    Your next appointment:   4 month(s)  The format for your next appointment:   In Person  Provider:   Eleonore Chiquito, MD   Other Instructions Exercise Information  Staying physically active is important as you age. The four types of exercises that are best for older adults are endurance, strength, balance, and flexibility. Contact your health care provider before you start any exercise routine. Ask your  health care provider what activities are safe for you. What are the risks? Risks associated with exercising include: Overdoing it. This may lead to sore muscles or fatigue. Falls. Injuries. Dehydration. How to do these exercises Endurance exercises Endurance (aerobic) exercises raise your breathing rate and heart rate. Increasing your endurance helps you to do everyday tasks and stay healthy. By improving the health of your body system that includes your heart, lungs, and blood vessels (circulatory system), you may also delay or prevent diseases such as heart disease, diabetes, and bone loss (osteoporosis). Types of endurance exercises include: Sports. Indoor activities, such as using gym equipment, doing water aerobics, or dancing. Outdoor activities, such as biking or jogging. Tasks around the house, such as gardening, yard work, and heavy household chores like cleaning. Walking, such as hiking or walking around your neighborhood. When doing endurance exercises, make sure you: Are aware of your surroundings. Use safety equipment as directed. Dress in layers when exercising outdoors. Drink plenty of water to stay well hydrated. Build up endurance slowly. Start with 10 minutes at a time, and gradually build up to doing 30 minutes at a time. Unless your health care provider gave you different instructions, aim to exercise for a total of 150 minutes a week. Spread out that time so you are working on endurance on 3 or more days a week. Strength exercises Lifting, pulling, or pushing weights helps to strengthen muscles. Having stronger muscles makes it easier to do everyday activities, such as getting up from a chair, climbing stairs, carrying groceries, and playing with grandchildren.  Strength exercises include arm and leg exercises that may be done: With weights. Without weights (using your own body weight). With a resistance band. When doing strength exercises: Move smoothly and steadily.  Do not suddenly thrust or jerk the weights, the resistance band, or your body. Start with no weights or with light weights, and gradually add more weight over time. Eventually, aim to use weights that are hard or very hard for you to lift. This means that you are able to do 8 repetitions with the weight, and the last few repetitions are very challenging. Lift or push weights into position for 3 seconds, hold the position for 1 second, and then take 3 seconds to return to your starting position. Breathe out (exhale) during difficult movements, like lifting or pushing weights. Breathe in (inhale) to relax your muscles before the next repetition. Consider alternating arms or legs, especially when you first start strength exercises. Expect some slight muscle soreness after each session. Do strength exercises on 2 or more days a week, for 30 minutes at a time. Avoid exercising the same muscle groups two days in a row. For example, if you work on your leg muscles one day, work on your arm muscles the next day. When you can do two sets of 10-15 repetitions with a certain weight, increase the amount of weight. Balance Balance exercises can help to prevent falls. Balance exercises include: Standing on one foot. Heel-to-toe walk. Balance walk. Tai chi. Make sure you have something sturdy to hold onto while doing balance exercises, such as a sturdy chair. As your balance improves, challenge yourself by holding onto the chair with one hand instead of two, and then with no hands. Trying exercises with your eyes closed also challenges your balance, but be sure to have a sturdy surface (like a countertop) close by in case you need it. Do balance exercises as often as you want, or as often as directed by your health care provider. Strength exercises for the lower body also help to improve balance. Flexibility Flexibility exercises improve how far you can bend, straighten, move, or rotate parts of your body (range of  motion). These exercises also help you to do everyday activities such as getting dressed or reaching for objects. Flexibility exercises include stretching different parts of the body, and they may be done in a standing or seated position or on the floor. When stretching, make sure you: Keep a slight bend in your arms and legs. Avoid completely straightening ("locking") your joints. Do not stretch so far that you feel pain. You should feel a mild stretching feeling. You may try stretching farther as you become more flexible over time. Relax and breathe between stretches. Hold onto something sturdy for balance as needed. Hold each stretch for 10-30 seconds. Repeat each stretch 3-5 times. General safety tips Exercise in well-lit areas. Do not hold your breath during exercises or stretches. Warm up before exercising, and cool down after exercising. This can help prevent injury. Drink plenty of water during exercise or any activity that makes you sweat. Use smooth, steady movements. Do not use sudden, jerking movements, especially when lifting weights or doing flexibility exercises. If you are not sure if an exercise is safe for you, or you are not sure how to do an exercise, talk with your health care provider. This is especially important if you have had surgery on muscles, bones, or joints (orthopedic surgery). Where to find more information You can find more information about exercise for  older adults from: Your local health department, fitness center, or community center. These facilities may have programs for aging adults. [employer] on Aging: http://kim-miller.com/ National Council on Aging: www.ncoa.org Summary Staying physically active is important as you age. Make sure to contact your health care provider before you start any exercise routine. Ask your health care provider what activities are safe for you. Doing endurance, strength, balance, and flexibility exercises can help to delay or  prevent certain diseases, such as heart disease, diabetes, and bone loss (osteoporosis). This information is not intended to replace advice given to you by your health care provider. Make sure you discuss any questions you have with your health care provider. Document Revised: 02/24/2020 Document Reviewed: 02/24/2020 Elsevier Patient Education  [place].

## 2021-09-21 ENCOUNTER — Ambulatory Visit: Payer: 59 | Admitting: Orthopaedic Surgery

## 2021-10-17 ENCOUNTER — Other Ambulatory Visit (HOSPITAL_COMMUNITY): Payer: Self-pay | Admitting: Family Medicine

## 2021-10-17 DIAGNOSIS — Z1231 Encounter for screening mammogram for malignant neoplasm of breast: Secondary | ICD-10-CM

## 2021-10-30 ENCOUNTER — Other Ambulatory Visit: Payer: Self-pay

## 2021-10-30 ENCOUNTER — Ambulatory Visit (HOSPITAL_COMMUNITY)
Admission: RE | Admit: 2021-10-30 | Discharge: 2021-10-30 | Disposition: A | Discharge status: Home / Self Care | Payer: 59 | Source: Ambulatory Visit | Attending: Family Medicine | Admitting: Family Medicine

## 2021-10-30 DIAGNOSIS — Z1231 Encounter for screening mammogram for malignant neoplasm of breast: Secondary | ICD-10-CM | POA: Diagnosis present

## 2021-11-05 ENCOUNTER — Telehealth: Payer: Self-pay | Admitting: Orthopaedic Surgery

## 2021-12-24 NOTE — Progress Notes (Signed)
Cardiology Office Note:   Date:  12/25/2021  NAME:  Jillian Adkins    MRN: [id number] DOB:  Feb 29, 1966   PCP:  Jake Samples, PA-C  Cardiologist:  None  Electrophysiologist:  None   Referring MD: Jake Samples, PA*   Chief Complaint  Patient presents with   Follow-up   History of Present Illness:   Jillian Adkins is a 56 y.o. female with a hx of DM, HTN, obesity who presents for follow-up. Seen in October for BP. Started on aldactone.   She presents with her blood pressure log.  Values do not control.  Systolic blood pressures are between 140-160.  Diastolic pressures are between 100-120.  She is currently on amlodipine 10 mg daily, spironolactone 25 mg daily, valsartan 320 mg daily, hydrochlorothiazide 25 mg daily.  She reports no chest pain or trouble breathing.  She is not exercising.  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  She reports she has tried to lose weight but not successful.  BMI 42.  Her weight today in office is 245.  She does snore and is fatigued.  She does report gasping for air at night.  I do wonder if she has sleep apnea.  She is okay to do a home sleep study.  She reports eating a lot of bread.  I suspect there is a lot of salty meal consumption.  She seems to be doing the best she can.  Weight loss would help.  Exercise would also help.  She overall is without other complaints.  She did have a renal duplex study in 2022 that was negative.  Cardiovascular examination is normal.  Problem List DM -A1c 6.9 HTN -normal renal artery duplex 04/28/2021 HLD -T chol 171, HDL 42, LDL 111, TG 98 4.  Obesity -BMI 42  Past Medical History: Past Medical History:  Diagnosis Date   Diabetes mellitus without complication ([place])    Hypertension     Past Surgical History: Past Surgical History:  Procedure Laterality Date   CESAREAN SECTION  [phone]   COLONOSCOPY N/A 08/08/2018   Procedure: COLONOSCOPY;  Surgeon: Daneil Dolin, MD;   Location[facility];  Service: Endoscopy;  Laterality: N/A;  11:30   POLYPECTOMY  08/08/2018   Procedure: POLYPECTOMY;  Surgeon: Daneil Dolin, MD;  Location: [facility];  Service: Endoscopy;;   TUBAL LIGATION      Current Medications: Current Meds  Medication Sig   amLODipine (NORVASC) 10 MG tablet Take 10 mg by mouth daily.   atorvastatin (LIPITOR) 10 MG tablet Take 1 tablet (10 mg total) by mouth daily.   carvedilol (COREG) 25 MG tablet Take 1 tablet (25 mg total) by mouth 2 (two) times daily.   Cholecalciferol (VITAMIN D3) 125 MCG (5000 UT) CAPS Take 1 capsule (5,000 Units total) by mouth daily.   Insulin Glargine (LANTUS SOLOSTAR) 100 UNIT/ML Solostar Pen Inject 50 Units into the skin at bedtime. (Patient taking differently: Inject 15 Units into the skin at bedtime.)   Insulin Pen Needle (B-D ULTRAFINE III SHORT PEN) 31G X 8 MM MISC 1 each by Does not apply route as directed.   metFORMIN (GLUCOPHAGE) 1000 MG tablet Take 500 mg by mouth 2 (two) times daily with a meal.   naproxen (NAPROSYN) 500 MG tablet TAKE 1 TABLET BY MOUTH 2 TIMES DAILY WITH A MEAL.   OVER THE COUNTER MEDICATION Take 1 Dose by mouth daily. Potassium 650 mg per patient  spironolactone (ALDACTONE) 25 MG tablet Take 1 tablet (25 mg total) by mouth daily.   TRULICITY 3 FS/1.4EL SOPN Inject 3 mg into the skin once a week.   valsartan-hydrochlorothiazide (DIOVAN-HCT) 320-25 MG tablet Take 1 tablet by mouth daily.     Allergies:    Patient has no known allergies.   Social History: Social History   Socioeconomic History   Marital status: Married    Spouse name: Not on file   Number of children: 3   Years of education: Not on file   Highest education level: Not on file  Occupational History   Occupation: Nurse  Tobacco Use   Smoking status: Never   Smokeless tobacco: Never  Vaping Use   Vaping Use: Never used  Substance and Sexual Activity   Alcohol use: No   Drug use: No   Sexual activity: Yes     Birth control/protection: Surgical    Comment: tubal  Other Topics Concern   Not on file  Social History Narrative   Not on file   Social Determinants of Health   Financial Resource Strain: Not on file  Food Insecurity: Not on file  Transportation Needs: Not on file  Physical Activity: Not on file  Stress: Not on file  Social Connections: Not on file     Family History: The patient's family history includes Diabetes in her mother; Hypertension in her mother. There is no history of Colon cancer.  ROS:   All other ROS reviewed and negative. Pertinent positives noted in the HPI.     EKGs/Labs/Other Studies Reviewed:   The following studies were personally reviewed by me today:   Recent Labs: No results found for requested labs within last 8760 hours.   Recent Lipid Panel    Component Value Date/Time   CHOL 142 02/23/2017 0852   TRIG 66 02/23/2017 0852   HDL 36 (L) 02/23/2017 0852   CHOLHDL 3.9 02/23/2017 0852   VLDL 13 02/23/2017 0852   LDLCALC 93 02/23/2017 0852    Physical Exam:   VS:  BP (!) 164/100    Pulse 88    Ht 5\' 4"  (1.626 m)    Wt 245 lb 3.2 oz (111.2 kg)    LMP 10/23/2015    SpO2 97%    BMI 42.09 kg/m    Wt Readings from Last 3 Encounters:  12/25/21 245 lb 3.2 oz (111.2 kg)  09/04/21 247 lb (112 kg)  08/24/21 245 lb (111.1 kg)    General: Well nourished, well developed, in no acute distress Head: Atraumatic, normal size  Eyes: PEERLA, EOMI  Neck: Supple, no JVD Endocrine: No thryomegaly Cardiac: Normal S1, S2; RRR; no murmurs, rubs, or gallops Lungs: Clear to auscultation bilaterally, no wheezing, rhonchi or rales  Abd: Soft, nontender, no hepatomegaly  Ext: No edema, pulses 2+ Musculoskeletal: No deformities, BUE and BLE strength normal and equal Skin: Warm and dry, no rashes   Neuro: Alert and oriented to person, place, time, and situation, CNII-XII grossly intact, no focal deficits  Psych: Normal mood and affect   ASSESSMENT:   Jillian Adkins is a 56 y.o. female who presents for the following: 1. Primary hypertension   2. Obesity, morbid, BMI 40.0-49.9 ([place])   3. Snoring   4. Other fatigue     PLAN:   1. Primary hypertension 2. Obesity, morbid, BMI 40.0-49.9 ([place]) 3. Snoring 4. Other fatigue -She has fairly resistant hypertension.  I truly suspect this is related to obesity and inactivity.  I suspect diet is also contributing.  She has had negative renal artery duplex in the past.  We will check an echocardiogram.  She also has started menopause symptoms.  This clearly could be contributing.  She also snores and is fatigued.  I do want her to get a home sleep study.  She is on amlodipine 10 mg daily, Aldactone 25 mg daily, valsartan 320 mg daily, HCTZ 25 mg daily.  We will check a renin and aldosterone level as well as updated TSH.  We will also check a kidney panel today.  I really do not suspect a secondary cause we will make sure.  I suspect sleep apnea could be contributing.  This may make more sense to proceed with as we are doing.  Regardless bread is still a big source of salt in her diet.  I have asked her to cut some of this out.  I will see her back in 3 months.  We also discussed referral to [facility] to Dr. Skeet Latch who is a hypertension specialist.  She reports for now she would like to remain in [facility].  If her blood pressure is uncontrolled after our next visit she will have to go to [place].  Disposition: Return in about 3 months (around 03/24/2022).  Medication Adjustments/Labs and Tests Ordered: Current medicines are reviewed at length with the patient today.  Concerns regarding medicines are outlined above.  Orders Placed This Encounter  Procedures   Aldosterone + renin activity w/ ratio   Basic metabolic panel   B Nat Peptide   TSH   [facility] Sleep Study   Meds ordered this encounter  Medications   atorvastatin (LIPITOR) 10 MG tablet    Sig: Take 1 tablet (10 mg total) by mouth  daily.    Dispense:  90 tablet    Refill:  3   carvedilol (COREG) 25 MG tablet    Sig: Take 1 tablet (25 mg total) by mouth 2 (two) times daily.    Dispense:  180 tablet    Refill:  3    Patient Instructions  Medication Instructions:  START Coreg 25 mg tablets twice daily START Lipitor 10 mg tablets daily  Labwork: BMET BNP TSH ALD  Testing/Procedures: Sleep Study  Follow-Up: Follow up with Dr. Jenetta DownerNori Riis in 3 months.   Any Other Special Instructions Will Be Listed Below (If Applicable).     If you need a refill on your cardiac medications before your next appointment, please call your pharmacy.      Time Spent with Patient: I have spent a total of 35 minutes with patient reviewing hospital notes, telemetry, EKGs, labs and examining the patient as well as establishing an assessment and plan that was discussed with the patient.  > 50% of time was spent in direct patient care.  Signed, Addison Naegeli. Audie Box, MD, [place]  [address] [place], [place] [address] [phone]  12/25/2021 2:39 PM

## 2021-12-25 ENCOUNTER — Encounter: Payer: Self-pay | Admitting: Cardiovascular Disease

## 2021-12-25 ENCOUNTER — Other Ambulatory Visit (HOSPITAL_COMMUNITY)
Admission: RE | Admit: 2021-12-25 | Discharge: 2021-12-25 | Disposition: A | Discharge status: Home / Self Care | Payer: Managed Care, Other (non HMO) | Source: Ambulatory Visit | Attending: Cardiovascular Disease | Admitting: Cardiovascular Disease

## 2021-12-25 ENCOUNTER — Other Ambulatory Visit: Payer: Self-pay

## 2021-12-25 ENCOUNTER — Ambulatory Visit (INDEPENDENT_AMBULATORY_CARE_PROVIDER_SITE_OTHER): Payer: Managed Care, Other (non HMO) | Admitting: Cardiovascular Disease

## 2021-12-25 VITALS — BP 164/100 | HR 88 | Ht 64.0 in | Wt 245.2 lb

## 2021-12-25 DIAGNOSIS — R0683 Snoring: Secondary | ICD-10-CM | POA: Diagnosis not present

## 2021-12-25 DIAGNOSIS — I10 Essential (primary) hypertension: Secondary | ICD-10-CM | POA: Diagnosis not present

## 2021-12-25 DIAGNOSIS — R5383 Other fatigue: Secondary | ICD-10-CM

## 2021-12-25 LAB — BASIC METABOLIC PANEL
Anion gap: 8 (ref 5–15)
BUN: 22 mg/dL — ABNORMAL HIGH (ref 6–20)
CO2: 26 mmol/L (ref 22–32)
Calcium: 9.8 mg/dL (ref 8.9–10.3)
Chloride: 103 mmol/L (ref 98–111)
Creatinine, Ser: 0.99 mg/dL (ref 0.44–1.00)
GFR, Estimated: >60 mL/min (ref >60)
Glucose, Bld: 150 mg/dL — ABNORMAL HIGH (ref 70–99)
Potassium: 3.5 mmol/L (ref 3.5–5.1)
Sodium: 137 mmol/L (ref 135–145)

## 2021-12-25 LAB — BRAIN NATRIURETIC PEPTIDE: B Natriuretic Peptide: 8 pg/mL (ref 0.0–100.0)

## 2021-12-25 LAB — TSH: TSH: 0.983 u[IU]/mL (ref 0.350–4.500)

## 2021-12-25 MED ORDER — CARVEDILOL 25 MG PO TABS: 25.0000 mg | ORAL_TABLET | Freq: Two times a day (BID) | ORAL | 3 refills | Status: DC

## 2021-12-25 MED ORDER — ATORVASTATIN CALCIUM 10 MG PO TABS: 10.0000 mg | ORAL_TABLET | Freq: Every day | ORAL | 3 refills | Status: DC

## 2021-12-25 NOTE — Patient Instructions (Addendum)
Medication Instructions:  START Coreg 25 mg tablets twice daily START Lipitor 10 mg tablets daily  Labwork: BMET BNP TSH ALD  Testing/Procedures: Sleep Study  Follow-Up: Follow up with Dr. Jenetta DownerNori Riis in 3 months.   Any Other Special Instructions Will Be Listed Below (If Applicable).     If you need a refill on your cardiac medications before your next appointment, please call your pharmacy.

## 2021-12-31 LAB — ALDOSTERONE + RENIN ACTIVITY W/ RATIO
ALDO / PRA Ratio: 15.2 (ref 0.0–30.0)
Aldosterone: 12.1 ng/dL (ref 0.0–30.0)
PRA LC/MS/MS: 0.796 ng/mL/hr (ref 0.167–5.380)

## 2022-01-05 ENCOUNTER — Telehealth: Payer: Self-pay | Admitting: Cardiovascular Disease

## 2022-01-05 NOTE — Telephone Encounter (Signed)
Left a message for the patient to call back.  

## 2022-01-05 NOTE — Telephone Encounter (Signed)
Patient returning call for lab results. 

## 2022-01-08 ENCOUNTER — Telehealth: Payer: Self-pay | Admitting: Cardiovascular Disease

## 2022-01-08 NOTE — Telephone Encounter (Signed)
Patient is returning call about results. Please call back 

## 2022-01-08 NOTE — Telephone Encounter (Signed)
Left detailed message for patient that labs were normal.

## 2022-01-09 NOTE — Telephone Encounter (Signed)
Left message for pt to call.

## 2022-01-18 NOTE — Telephone Encounter (Signed)
See [facility] result note ?

## 2022-02-05 ENCOUNTER — Telehealth: Payer: Self-pay | Admitting: *Deleted

## 2022-02-05 NOTE — Telephone Encounter (Signed)
Prior Authorization for [facility] sent to [employer] via Phone.  ? ?NO PA REQUIRED ? ?CONFIRMATION #  [id number] ?

## 2022-02-06 NOTE — Telephone Encounter (Signed)
This was sent to me, though this was set up in the [place] office, not [place]. I will forward to the correct person to let them know of auth for [facility].  ?

## 2022-03-26 ENCOUNTER — Encounter: Payer: Self-pay | Admitting: Cardiovascular Disease

## 2022-03-26 ENCOUNTER — Ambulatory Visit (INDEPENDENT_AMBULATORY_CARE_PROVIDER_SITE_OTHER): Payer: Managed Care, Other (non HMO) | Admitting: Cardiovascular Disease

## 2022-03-26 VITALS — BP 140/100 | HR 80 | Wt 246.2 lb

## 2022-03-26 DIAGNOSIS — I10 Essential (primary) hypertension: Secondary | ICD-10-CM

## 2022-03-26 NOTE — Patient Instructions (Signed)
Medication Instructions:  ?No changes today ? ?Labwork: ?None ? ?Testing/Procedures: ?Your physician has requested that you have an echocardiogram. Echocardiography is a painless test that uses sound waves to create images of your heart. It provides your doctor with information about the size and shape of your heart and how well your heart?s chambers and valves are working. This procedure takes approximately one hour. There are no restrictions for this procedure. ? ? ?Follow-Up: ?Follow up with Dr. Jenetta DownerNori Riis in 6 months.  ? ?Any Other Special Instructions Will Be Listed Below (If Applicable). ? ? ? ? ?If you need a refill on your cardiac medications before your next appointment, please call your pharmacy. ? ?

## 2022-03-26 NOTE — Progress Notes (Signed)
?Cardiology Office Note:   ?Date:  03/26/2022  ?NAME:  Jillian Adkins    ?MRN: [id number] ?DOB:  01/25/1966  ? ?PCP:  Jake Samples, PA-C  ?Cardiologist:  None  ?Electrophysiologist:  None  ? ?Referring MD: Jake Samples, PA*  ? ?Chief Complaint  ?Patient presents with  ? Follow-up  ? ?History of Present Illness:   ?Jillian Adkins is a 56 y.o. female with a hx of obesity, hypertension, hyperlipidemia, diabetes who presents for follow-up.  She reports she is doing well.  Denies any chest pain or trouble breathing.  Not exercising that much.  Weight around 246 pounds.  Blood pressure in office 140/100.  We discussed increasing her Aldactone.  We also discussed Entresto.  She will currently not qualify for this medication but if she has evidence of hypertensive heart disease I believe she would qualify.  She has no secondary cause of hypertension identified.  She is adhering to a low-salt diet.  No sleep apnea concerns.  She could exercise a bit more but seems to be doing well.  She is lost a lot of weight on Trulicity.  Overall blood pressures still ranging between 751-025 systolically.  Still not at goal despite 5 medications. ? ?Problem List ?DM ?-A1c 6.9 ?HTN ?-normal renal artery duplex 04/28/2021 ?HLD ?-T chol 171, HDL 42, LDL 111, TG 98 ?4.  Obesity ?-BMI 42 ? ?Past Medical History: ?Past Medical History:  ?Diagnosis Date  ? Diabetes mellitus without complication ([place])   ? Hypertension   ? ? ?Past Surgical History: ?Past Surgical History:  ?Procedure Laterality Date  ? CESAREAN SECTION  [phone]  ? COLONOSCOPY N/A 08/08/2018  ? Procedure: COLONOSCOPY;  Surgeon: Daneil Dolin, MD;  [facility];  Service: Endoscopy;  Laterality: N/A;  11:30  ? POLYPECTOMY  08/08/2018  ? Procedure: POLYPECTOMY;  Surgeon: Daneil Dolin, MD;  [facility];  Service: Endoscopy;;  ? TUBAL LIGATION    ? ? ?Current Medications: ?Current Meds  ?Medication Sig  ? amLODipine (NORVASC) 10 MG tablet  Take 10 mg by mouth daily.  ? atorvastatin (LIPITOR) 10 MG tablet Take 1 tablet (10 mg total) by mouth daily.  ? carvedilol (COREG) 25 MG tablet Take 1 tablet (25 mg total) by mouth 2 (two) times daily.  ? Cholecalciferol (VITAMIN D3) 125 MCG (5000 UT) CAPS Take 1 capsule (5,000 Units total) by mouth daily.  ? insulin glargine (LANTUS) 100 UNIT/ML injection Inject 10 Units into the skin daily.  ? metFORMIN (GLUCOPHAGE) 1000 MG tablet Take 500 mg by mouth 2 (two) times daily with a meal.  ? naproxen (NAPROSYN) 500 MG tablet TAKE 1 TABLET BY MOUTH 2 TIMES DAILY WITH A MEAL.  ? OVER THE COUNTER MEDICATION Take 1 Dose by mouth daily. Potassium 650 mg per patient  ? spironolactone (ALDACTONE) 25 MG tablet Take 1 tablet (25 mg total) by mouth daily.  ? TRULICITY 3 IR/4.4RX SOPN Inject 3 mg into the skin once a week.  ? valsartan-hydrochlorothiazide (DIOVAN-HCT) 320-25 MG tablet Take 1 tablet by mouth daily.  ?  ? ?Allergies:    ?Patient has no known allergies.  ? ?Social History: ?Social History  ? ?Socioeconomic History  ? Marital status: Married  ?  Spouse name: Not on file  ? Number of children: 3  ? Years of education: Not on file  ? Highest education level: Not on file  ?Occupational History  ? Occupation: Nurse  ?Tobacco Use  ? Smoking status:  Never  ? Smokeless tobacco: Never  ?Vaping Use  ? Vaping Use: Never used  ?Substance and Sexual Activity  ? Alcohol use: No  ? Drug use: No  ? Sexual activity: Yes  ?  Birth control/protection: Surgical  ?  Comment: tubal  ?Other Topics Concern  ? Not on file  ?Social History Narrative  ? Not on file  ? ?Social Determinants of Health  ? ?Financial Resource Strain: Not on file  ?Food Insecurity: Not on file  ?Transportation Needs: Not on file  ?Physical Activity: Not on file  ?Stress: Not on file  ?Social Connections: Not on file  ?  ? ?Family History: ?The patient's family history includes Diabetes in her mother; Hypertension in her mother. There is no history of Colon  cancer. ? ?ROS:   ?All other ROS reviewed and negative. Pertinent positives noted in the HPI.    ? ?EKGs/Labs/Other Studies Reviewed:   ?The following studies were personally reviewed by me today: ? ?Recent Labs: ?12/25/2021: B Natriuretic Peptide 8.0; BUN 22; Creatinine, Ser 0.99; Potassium 3.5; Sodium 137; TSH 0.983  ? ?Recent Lipid Panel ?   ?Component Value Date/Time  ? CHOL 142 02/23/2017 0852  ? TRIG 66 02/23/2017 0852  ? HDL 36 (L) 02/23/2017 0852  ? CHOLHDL 3.9 02/23/2017 0852  ? VLDL 13 02/23/2017 0852  ? [place] 93 02/23/2017 0852  ? ? ?Physical Exam:   ?VS:  BP (!) 140/100   Pulse 80   Wt 246 lb 3.2 oz (111.7 kg)   LMP 10/23/2015   SpO2 98%   BMI 42.26 kg/m?    ?Wt Readings from Last 3 Encounters:  ?03/26/22 246 lb 3.2 oz (111.7 kg)  ?12/25/21 245 lb 3.2 oz (111.2 kg)  ?09/04/21 247 lb (112 kg)  ?  ?General: Well nourished, well developed, in no acute distress ?Head: Atraumatic, normal size  ?Eyes: PEERLA, EOMI  ?Neck: Supple, no JVD ?Endocrine: No thryomegaly ?Cardiac: Normal S1, S2; RRR; no murmurs, rubs, or gallops ?Lungs: Clear to auscultation bilaterally, no wheezing, rhonchi or rales  ?Abd: Soft, nontender, no hepatomegaly  ?Ext: No edema, pulses 2+ ?Musculoskeletal: No deformities, BUE and BLE strength normal and equal ?Skin: Warm and dry, no rashes   ?Neuro: Alert and oriented to person, place, time, and situation, CNII-XII grossly intact, no focal deficits  ?Psych: Normal mood and affect  ? ?ASSESSMENT:   ?Jillian Adkins is a 56 y.o. female who presents for the following: ?1. Primary hypertension   ?2. Obesity, morbid, BMI 40.0-49.9 ([place])   ? ? ?PLAN:   ?1. Primary hypertension ?2. Obesity, morbid, BMI 40.0-49.9 ([place]) ?-Blood pressure still remains in adequately controlled.  Currently on amlodipine 10 mg daily, carvedilol 25 mg twice daily, Aldactone 25 mg daily, valsartan 320 mg daily, HCTZ 25 mg daily.  Renal artery duplex negative.  She is adhering to a low-salt diet.  Could exercise more.   Obesity is a big issue but working on this.  No concerns for sleep apnea per her report.  She did screen negative for renal and aldosterone levels for possible hyperaldosteronism.  I really Jillian no other secondary cause to work-up.  I would like for her to get an echocardiogram.  If she has evidence of hypertensive heart disease we will likely transition her to [facility].  I believe she would qualify based on her insurance.  We will Jillian if her echo has any evidence of this.  If it does not we could increase her Aldactone to 50  mg daily.  I did discuss with her going to [facility] to Jillian Dr. Skeet Latch in the hypertension clinic but she is reluctant to travel to [facility].  She will Jillian me back in 6 months. ? ?  ? ?Disposition: Return in about 6 months (around 09/26/2022). ? ?Medication Adjustments/Labs and Tests Ordered: ?Current medicines are reviewed at length with the patient today.  Concerns regarding medicines are outlined above.  ?Orders Placed This Encounter  ?Procedures  ? ECHOCARDIOGRAM COMPLETE  ? ?No orders of the defined types were placed in this encounter. ? ? ?Patient Instructions  ?Medication Instructions:  ?No changes today ? ?Labwork: ?None ? ?Testing/Procedures: ?Your physician has requested that you have an echocardiogram. Echocardiography is a painless test that uses sound waves to create images of your heart. It provides your doctor with information about the size and shape of your heart and how well your heart?s chambers and valves are working. This procedure takes approximately one hour. There are no restrictions for this procedure. ? ? ?Follow-Up: ?Follow up with Dr. Jenetta DownerNori Riis in 6 months.  ? ?Any Other Special Instructions Will Be Listed Below (If Applicable). ? ? ? ? ?If you need a refill on your cardiac medications before your next appointment, please call your pharmacy. ?  ? ?Time Spent with Patient: I have spent a total of 25 minutes with patient reviewing hospital notes, telemetry,  EKGs, labs and examining the patient as well as establishing an assessment and plan that was discussed with the patient.  > 50% of time was spent in direct patient care. ? ?Signed, ?[place] T. Audie Box, MD, [facility] ?[facility]

## 2022-04-05 ENCOUNTER — Ambulatory Visit (HOSPITAL_COMMUNITY)
Admission: RE | Admit: 2022-04-05 | Discharge: 2022-04-05 | Disposition: A | Discharge status: Home / Self Care | Payer: Managed Care, Other (non HMO) | Source: Ambulatory Visit | Attending: Cardiovascular Disease | Admitting: Cardiovascular Disease

## 2022-04-05 DIAGNOSIS — E119 Type 2 diabetes mellitus without complications: Secondary | ICD-10-CM | POA: Insufficient documentation

## 2022-04-05 DIAGNOSIS — E785 Hyperlipidemia, unspecified: Secondary | ICD-10-CM | POA: Insufficient documentation

## 2022-04-05 DIAGNOSIS — I10 Essential (primary) hypertension: Secondary | ICD-10-CM | POA: Diagnosis present

## 2022-04-05 LAB — ECHOCARDIOGRAM COMPLETE
AR max vel: 2.02 cm2
AV Area VTI: 2.19 cm2
AV Area mean vel: 2.18 cm2
AV Mean grad: 5 mmHg
AV Peak grad: 10.5 mmHg
Ao pk vel: 1.62 m/s
Area-P 1/2: 3.65 cm2
S' Lateral: 3 cm

## 2022-04-05 NOTE — Progress Notes (Signed)
*  PRELIMINARY RESULTS* Echocardiogram 2D Echocardiogram has been performed.  Jillian Adkins 04/05/2022, 3:30 PM

## 2022-08-23 ENCOUNTER — Other Ambulatory Visit: Payer: Self-pay | Admitting: Cardiovascular Disease

## 2022-09-02 NOTE — Progress Notes (Unsigned)
Cardiology Office Note:   Date:  09/03/2022  NAME:  Jillian Adkins    MRN: [id number] DOB:  1966/06/25   PCP:  Jake Samples, PA-C  Cardiologist:  None  Electrophysiologist:  None   Referring MD: Jake Samples, PA*   Chief Complaint  Patient presents with   Follow-up    History of Present Illness:   Jillian Adkins is a 56 y.o. female with below history who presents for follow-up.  Overall she is doing well.  Her blood pressure is well controlled today in office.  She denies any chest pain or trouble breathing.  She is working on dieting as well as exercising.  Her blood pressure at home ranges between 120-130.  She is on amlodipine 10 mg daily, carvedilol 25 mg twice daily, Aldactone 25 mg daily, valsartan-HCTZ 320-25 mg daily.  Problem List DM -A1c 6.9 HTN -normal renal artery duplex 04/28/2021 HLD -T chol 171, HDL 42, LDL 111, TG 98 4.  Obesity -BMI 42  Past Medical History: Past Medical History:  Diagnosis Date   Diabetes mellitus without complication ([place])    Hypertension     Past Surgical History: Past Surgical History:  Procedure Laterality Date   CESAREAN SECTION  [phone]   COLONOSCOPY N/A 08/08/2018   Procedure: COLONOSCOPY;  Surgeon: Daneil Dolin, MD;  [facility];  Service: Endoscopy;  Laterality: N/A;  11:30   POLYPECTOMY  08/08/2018   Procedure: POLYPECTOMY;  Surgeon: Daneil Dolin, MD;  [facility];  Service: Endoscopy;;   TUBAL LIGATION      Current Medications: Current Meds  Medication Sig   amLODipine (NORVASC) 10 MG tablet Take 10 mg by mouth daily.   atorvastatin (LIPITOR) 10 MG tablet Take 1 tablet (10 mg total) by mouth daily.   carvedilol (COREG) 25 MG tablet Take 1 tablet (25 mg total) by mouth 2 (two) times daily.   Cholecalciferol (VITAMIN D3) 125 MCG (5000 UT) CAPS Take 1 capsule (5,000 Units total) by mouth daily.   insulin glargine (LANTUS) 100 UNIT/ML injection Inject 10 Units into the  skin daily.   Insulin Pen Needle (B-D ULTRAFINE III SHORT PEN) 31G X 8 MM MISC 1 each by Does not apply route as directed.   metFORMIN (GLUCOPHAGE) 1000 MG tablet Take 500 mg by mouth 2 (two) times daily with a meal.   OVER THE COUNTER MEDICATION Take 1 Dose by mouth daily. Potassium 650 mg per patient   spironolactone (ALDACTONE) 25 MG tablet TAKE 1 TABLET (25 MG TOTAL) BY MOUTH DAILY.   TRULICITY 3 BO/1.7PZ SOPN Inject 3 mg into the skin once a week.   valsartan-hydrochlorothiazide (DIOVAN-HCT) 320-25 MG tablet Take 1 tablet by mouth daily.     Allergies:    Patient has no known allergies.   Social History: Social History   Socioeconomic History   Marital status: Married    Spouse name: Not on file   Number of children: 3   Years of education: Not on file   Highest education level: Not on file  Occupational History   Occupation: Nurse  Tobacco Use   Smoking status: Never   Smokeless tobacco: Never  Vaping Use   Vaping Use: Never used  Substance and Sexual Activity   Alcohol use: No   Drug use: No   Sexual activity: Yes    Birth control/protection: Surgical    Comment: tubal  Other Topics Concern   Not on file  Social History Narrative  Not on file   Social Determinants of Health   Financial Resource Strain: Not on file  Food Insecurity: Not on file  Transportation Needs: Not on file  Physical Activity: Not on file  Stress: Not on file  Social Connections: Not on file     Family History: The patient's family history includes Diabetes in her mother; Hypertension in her mother. There is no history of Colon cancer.  ROS:   All other ROS reviewed and negative. Pertinent positives noted in the HPI.     EKGs/Labs/Other Studies Reviewed:   The following studies were personally reviewed by me today:  EKG:  EKG is ordered today.  The ekg ordered today demonstrates normal sinus rhythm heart rate 88, no acute ischemic changes or evidence of infarction, and was  personally reviewed by me.   TTE 04/05/2022  1. Left ventricular ejection fraction, by estimation, is 65 to 70%. The  left ventricle has normal function. The left ventricle has no regional  wall motion abnormalities. There is mild left ventricular hypertrophy.  Left ventricular diastolic parameters  are indeterminate.   2. Right ventricular systolic function is normal. The right ventricular  size is normal.   3. The mitral valve is normal in structure. Trivial mitral valve  regurgitation.   4. The aortic valve is normal in structure. Aortic valve regurgitation is  not visualized.   Recent Labs: 12/25/2021: B Natriuretic Peptide 8.0; BUN 22; Creatinine, Ser 0.99; Potassium 3.5; Sodium 137; TSH 0.983   Recent Lipid Panel    Component Value Date/Time   CHOL 142 02/23/2017 0852   TRIG 66 02/23/2017 0852   HDL 36 (L) 02/23/2017 0852   CHOLHDL 3.9 02/23/2017 0852   VLDL 13 02/23/2017 0852   LDLCALC 93 02/23/2017 0852    Physical Exam:   VS:  BP 118/78   Pulse 86   Ht '5\' 4"'$  (1.626 m)   Wt 242 lb (109.8 kg)   LMP 10/23/2015   SpO2 98%   BMI 41.54 kg/m    Wt Readings from Last 3 Encounters:  09/03/22 242 lb (109.8 kg)  03/26/22 246 lb 3.2 oz (111.7 kg)  12/25/21 245 lb 3.2 oz (111.2 kg)    General: Well nourished, well developed, in no acute distress Head: Atraumatic, normal size  Eyes: PEERLA, EOMI  Neck: Supple, no JVD Endocrine: No thryomegaly Cardiac: Normal S1, S2; RRR; no murmurs, rubs, or gallops Lungs: Clear to auscultation bilaterally, no wheezing, rhonchi or rales  Abd: Soft, nontender, no hepatomegaly  Ext: No edema, pulses 2+ Musculoskeletal: No deformities, BUE and BLE strength normal and equal Skin: Warm and dry, no rashes   Neuro: Alert and oriented to person, place, time, and situation, CNII-XII grossly intact, no focal deficits  Psych: Normal mood and affect   ASSESSMENT:   Jillian Adkins is a 56 y.o. female who presents for the following: 1. Primary  hypertension   2. Obesity, morbid, BMI 40.0-49.9 ([place])     PLAN:   1. Primary hypertension 2. Obesity, morbid, BMI 40.0-49.9 ([place]) -She is on amlodipine 10 mg daily, carvedilol 25 mg twice daily, Aldactone 25 mg daily, valsartan-HCTZ 320-25 mg daily. -Blood pressure is much better controlled.  Most recent echo was normal.  Renal artery duplex was negative.  Her blood pressure is now controlled on current medications.  She is working on her diet as well as exercising. -She will see [place] back in 1 year  Disposition: Return in about 1 year (around 09/04/2023).  Medication  Adjustments/Labs and Tests Ordered: Current medicines are reviewed at length with the patient today.  Concerns regarding medicines are outlined above.  Orders Placed This Encounter  Procedures   EKG 12-Lead   No orders of the defined types were placed in this encounter.   Patient Instructions  Medication Instructions:  Your physician recommends that you continue on your current medications as directed. Please refer to the Current Medication list given to you today.   Labwork: None today  Testing/Procedures: None today  Follow-Up: 1 year  Any Other Special Instructions Will Be Listed Below (If Applicable).  If you need a refill on your cardiac medications before your next appointment, please call your pharmacy.    Time Spent with Patient: I have spent a total of 25 minutes with patient reviewing hospital notes, telemetry, EKGs, labs and examining the patient as well as establishing an assessment and plan that was discussed with the patient.  > 50% of time was spent in direct patient care.  Signed, Addison Naegeli. Audie Box, MD, [place]  [address], [place],  [address] [phone]  09/03/2022 2:18 PM

## 2022-09-03 ENCOUNTER — Encounter: Payer: Self-pay | Admitting: Cardiovascular Disease

## 2022-09-03 ENCOUNTER — Ambulatory Visit: Payer: Managed Care, Other (non HMO) | Attending: Cardiovascular Disease | Admitting: Cardiovascular Disease

## 2022-09-03 VITALS — BP 118/78 | HR 86 | Ht 64.0 in | Wt 242.0 lb

## 2022-09-03 DIAGNOSIS — I10 Essential (primary) hypertension: Secondary | ICD-10-CM | POA: Diagnosis not present

## 2022-09-03 NOTE — Patient Instructions (Signed)
Medication Instructions:  Your physician recommends that you continue on your current medications as directed. Please refer to the Current Medication list given to you today.   Labwork: None today  Testing/Procedures: None today  Follow-Up: 1 year  Any Other Special Instructions Will Be Listed Below (If Applicable).  If you need a refill on your cardiac medications before your next appointment, please call your pharmacy.  

## 2022-12-06 ENCOUNTER — Other Ambulatory Visit (HOSPITAL_COMMUNITY): Payer: Self-pay | Admitting: Family Medicine

## 2022-12-06 DIAGNOSIS — Z1231 Encounter for screening mammogram for malignant neoplasm of breast: Secondary | ICD-10-CM

## 2022-12-17 ENCOUNTER — Ambulatory Visit (HOSPITAL_COMMUNITY)
Admission: RE | Admit: 2022-12-17 | Discharge: 2022-12-17 | Disposition: A | Discharge status: Home / Self Care | Payer: Managed Care, Other (non HMO) | Source: Ambulatory Visit | Attending: Family Medicine | Admitting: Family Medicine

## 2022-12-17 DIAGNOSIS — Z1231 Encounter for screening mammogram for malignant neoplasm of breast: Secondary | ICD-10-CM | POA: Insufficient documentation

## 2022-12-19 ENCOUNTER — Other Ambulatory Visit (HOSPITAL_COMMUNITY): Payer: Self-pay | Admitting: Family Medicine

## 2022-12-19 DIAGNOSIS — R928 Other abnormal and inconclusive findings on diagnostic imaging of breast: Secondary | ICD-10-CM

## 2022-12-25 ENCOUNTER — Other Ambulatory Visit: Payer: Self-pay | Admitting: Cardiovascular Disease

## 2022-12-26 ENCOUNTER — Other Ambulatory Visit: Payer: Self-pay | Admitting: Cardiovascular Disease

## 2023-01-08 ENCOUNTER — Encounter (HOSPITAL_COMMUNITY): Payer: Self-pay

## 2023-01-08 ENCOUNTER — Ambulatory Visit (HOSPITAL_COMMUNITY)
Admission: RE | Admit: 2023-01-08 | Discharge: 2023-01-08 | Disposition: A | Discharge status: Home / Self Care | Payer: Managed Care, Other (non HMO) | Source: Ambulatory Visit | Attending: Family Medicine | Admitting: Family Medicine

## 2023-01-08 DIAGNOSIS — R928 Other abnormal and inconclusive findings on diagnostic imaging of breast: Secondary | ICD-10-CM | POA: Diagnosis not present

## 2023-01-17 ENCOUNTER — Encounter: Payer: Self-pay | Admitting: Radiology

## 2023-01-30 ENCOUNTER — Other Ambulatory Visit: Payer: Self-pay | Admitting: Cardiovascular Disease

## 2023-07-02 ENCOUNTER — Encounter: Payer: Self-pay | Admitting: *Deleted

## 2023-08-14 ENCOUNTER — Ambulatory Visit: Payer: Managed Care, Other (non HMO) | Attending: Internal Medicine | Admitting: Internal Medicine

## 2023-08-14 ENCOUNTER — Encounter: Payer: Self-pay | Admitting: Internal Medicine

## 2023-08-14 VITALS — BP 126/78 | HR 86 | Ht 60.0 in | Wt 237.0 lb

## 2023-08-14 DIAGNOSIS — E7849 Other hyperlipidemia: Secondary | ICD-10-CM

## 2023-08-14 DIAGNOSIS — T461X5A Adverse effect of calcium-channel blockers, initial encounter: Secondary | ICD-10-CM | POA: Diagnosis not present

## 2023-08-14 DIAGNOSIS — I10 Essential (primary) hypertension: Secondary | ICD-10-CM | POA: Diagnosis not present

## 2023-08-14 DIAGNOSIS — E785 Hyperlipidemia, unspecified: Secondary | ICD-10-CM | POA: Insufficient documentation

## 2023-08-14 NOTE — Patient Instructions (Signed)
Medication Instructions:  Your physician has recommended you make the following change in your medication:   -Stop Amlodipine   *If you need a refill on your cardiac medications before your next appointment, please call your pharmacy*   Lab Work: None If you have labs (blood work) drawn today and your tests are completely normal, you will receive your results only by: MyChart Message (if you have MyChart) OR A paper copy in the mail If you have any lab test that is abnormal or we need to change your treatment, we will call you to review the results.   Testing/Procedures: None   Follow-Up: At [facility], you and your health needs are our priority.  As part of our continuing mission to provide you with exceptional heart care, we have created designated Provider Care Teams.  These Care Teams include your primary Cardiologist (physician) and Advanced Practice Providers (APPs -  Physician Assistants and Nurse Practitioners) who all work together to provide you with the care you need, when you need it.  We recommend signing up for the patient portal called "MyChart".  Sign up information is provided on this After Visit Summary.  MyChart is used to connect with patients for Virtual Visits (Telemedicine).  Patients are able to view lab/test results, encounter notes, upcoming appointments, etc.  Non-urgent messages can be sent to your provider as well.   To learn more about what you can do with MyChart, go to [email or web address].    Your next appointment:   1 year(s)  Provider:   You may see Vishnu P Mallipeddi, MD or one of the following Advanced Practice Providers on your designated Care Team:   [place], PA-C  Jacolyn Reedy, [place]     Other Instructions

## 2023-08-14 NOTE — Progress Notes (Signed)
Cardiology Office Note  Date: 08/14/2023   ID: [name], DOB Jun 07, 1966, MRN [id number]  PCP:  Avis Epley, PA-C  Cardiologist:  Marjo Bicker, MD Electrophysiologist:  None   History of Present Illness: Jillian Adkins is a 57 y.o. Adkins known to have HTN, DM 2 is here for follow-up visit of HTN.  Control blood pressures at home, current and Mounjaro, noticing weight loss.  She has patches of hyperpigmentation sprayed on her face, neck and sun exposed areas of bilateral arms for the last 1 year.  She was seen by dermatologist recently and was told amlodipine could be causing this.  Overall doing great, no symptoms.  No angina, DOE, orthopnea, PND, dizziness, presyncope, syncope and palpitations.  No leg swelling.  Compliant with medications.  Past Medical History:  Diagnosis Date   Diabetes mellitus without complication (HCC)    Hypertension     Past Surgical History:  Procedure Laterality Date   CESAREAN SECTION  [phone]   COLONOSCOPY N/A 08/08/2018   Procedure: COLONOSCOPY;  Surgeon: Corbin Ade, MD;  [facility];  Service: Endoscopy;  Laterality: N/A;  11:30   POLYPECTOMY  08/08/2018   Procedure: POLYPECTOMY;  Surgeon: Corbin Ade, MD;  [facility];  Service: Endoscopy;;   TUBAL LIGATION      Current Outpatient Medications  Medication Sig Dispense Refill   amLODipine (NORVASC) 10 MG tablet Take 10 mg by mouth daily.     atorvastatin (LIPITOR) 10 MG tablet TAKE 1 TABLET BY MOUTH EVERY DAY 90 tablet 3   carvedilol (COREG) 25 MG tablet TAKE 1 TABLET BY MOUTH TWICE A DAY 180 tablet 3   Cholecalciferol (VITAMIN D3) 125 MCG (5000 UT) CAPS Take 1 capsule (5,000 Units total) by mouth daily. 90 capsule 0   Insulin Pen Needle (B-D ULTRAFINE III SHORT PEN) 31G X 8 MM MISC 1 each by Does not apply route as directed. 90 each 3   metFORMIN (GLUCOPHAGE) 1000 MG tablet Take 500 mg by mouth 2 (two) times daily with a meal.      MOUNJARO 10 MG/0.5ML Pen Inject 10 mg into the skin once a week.     OVER THE COUNTER MEDICATION Take 1 Dose by mouth daily. Potassium 650 mg per patient     spironolactone (ALDACTONE) 25 MG tablet Take 1 tablet (25 mg total) by mouth daily. 90 tablet 2   valsartan-hydrochlorothiazide (DIOVAN-HCT) 320-25 MG tablet Take 1 tablet by mouth daily.     No current facility-administered medications for this visit.   Allergies:  Patient has no known allergies.   Social History: The patient  reports that she has never smoked. She has never used smokeless tobacco. She reports that she does not drink alcohol and does not use drugs.   Family History: The patient's family history includes Diabetes in her mother; Hypertension in her mother.   ROS:  Please see the history of present illness. Otherwise, complete review of systems is positive for none.  All other systems are reviewed and negative.   Physical Exam: VS:  BP 126/78   Pulse 86   Ht 5' (1.524 m)   Wt 237 lb (107.5 kg)   LMP 10/23/2015   SpO2 98%   BMI 46.29 kg/m , BMI Body mass index is 46.29 kg/m.  Wt Readings from Last 3 Encounters:  08/14/23 237 lb (107.5 kg)  09/03/22 242 lb (109.8 kg)  03/26/22 246 lb 3.2 oz (111.7 kg)  General: Patient appears comfortable at rest. HEENT: Conjunctiva and lids normal, oropharynx clear with moist mucosa. Neck: Supple, no elevated JVP or carotid bruits, no thyromegaly. Lungs: Clear to auscultation, nonlabored breathing at rest. Cardiac: Regular rate and rhythm, no S3 or significant systolic murmur, no pericardial rub. Abdomen: Soft, nontender, no hepatomegaly, bowel sounds present, no guarding or rebound. Extremities: No pitting edema, distal pulses 2+. Skin: Warm and dry. Musculoskeletal: No kyphosis. Neuropsychiatric: Alert and oriented x3, affect grossly appropriate.  Recent Labwork: No results found for requested labs within last 365 days.     Component Value Date/Time   CHOL 142  02/23/2017 0852   TRIG 66 02/23/2017 0852   HDL 36 (L) 02/23/2017 0852   CHOLHDL 3.9 02/23/2017 0852   VLDL 13 02/23/2017 0852   LDLCALC 93 02/23/2017 0852     Assessment and Plan:  Amlodipine induced hyperpigmentation: Rare side effect, patches of hyperpigmentation are noted in the sun exposed areas of face, neck and bilateral arms for the last 1 year.  Discontinue amlodipine.  Not a candidate for other CCB's due to potential for cross-reactivity.  Follow-up with her dermatologist for the management of hyperpigmentation.  Per literature review, these lesions will resolve spontaneously in 1 year in a few cases, could persist.  HTN, controlled: Continue carvedilol 25 mg twice daily, spironolactone 25 mg once daily, valsartan-HCTZ 320-25 mg once daily.  Discontinue amlodipine as stated above.  Goal BP less than 130/80.  Instructed her to call the clinic if her BPs more than 140/90 mmHg.  Renal Doppler ultrasound was negative for renal artery stenosis.  HLD, unknown values: Continue atorvastatin 10 mg nightly, follows up with PCP.    Medication Adjustments/Labs and Tests Ordered: Current medicines are reviewed at length with the patient today.  Concerns regarding medicines are outlined above.    Disposition:  Follow up   Signed, Karrissa Parchment Verne Spurr, MD, 08/14/2023 2:33 PM    [facility] at [facility] [address], [place], [place] [address]

## 2023-09-26 ENCOUNTER — Telehealth: Payer: Self-pay | Admitting: *Deleted

## 2023-09-26 DIAGNOSIS — Z860100 Personal history of colon polyps, unspecified: Secondary | ICD-10-CM

## 2023-09-26 NOTE — Telephone Encounter (Signed)
error 

## 2023-09-26 NOTE — Addendum Note (Signed)
Addended by: Elinor Dodge on: 09/26/2023 04:44 PM   Modules accepted: Orders

## 2023-09-26 NOTE — Telephone Encounter (Signed)
  Procedure: colonoscopy  Height: 5'4" Weight: 239 lb       Have you had a colonoscopy before?  Yes, 2019,Dr.Rourk  Do you have family history of colon cancer?  no  Do you have a family history of polyps? no  Previous colonoscopy with polyps removed? Yes, 2019  Do you have a history colorectal cancer?   no  Are you diabetic?  Yes, Type 2  Do you have a prosthetic or mechanical heart valve? no  Do you have a pacemaker/defibrillator?   no  Have you had endocarditis/atrial fibrillation?  no  Do you use supplemental oxygen/CPAP?  no  Have you had joint replacement within the last 12 months?  no  Do you tend to be constipated or have to use laxatives?  no   Do you have history of alcohol use? If yes, how much and how often.  no  Do you have history or are you using drugs? If yes, what do are you  using?  no  Have you ever had a stroke/heart attack?  no  Have you ever had a heart or other vascular stent placed,?no  Do you take weight loss medication? no  female patients,: have you had a hysterectomy? no                              are you post menopausal?  no                              do you still have your menstrual cycle? no    Date of last menstrual period? 7 yrs ago  Do you take any blood-thinning medications such as: (Plavix, aspirin, Coumadin, Aggrenox, Brilinta, Xarelto, Eliquis, Pradaxa, Savaysa or Effient)? no  If yes we need the name, milligram, dosage and who is prescribing doctor:  n/a             Current Outpatient Medications  Medication Sig Dispense Refill   atorvastatin (LIPITOR) 10 MG tablet TAKE 1 TABLET BY MOUTH EVERY DAY 90 tablet 3   carvedilol (COREG) 25 MG tablet TAKE 1 TABLET BY MOUTH TWICE A DAY 180 tablet 3   metFORMIN (GLUCOPHAGE) 1000 MG tablet Take 500 mg by mouth 2 (two) times daily with a meal.     MOUNJARO 10 MG/0.5ML Pen Inject 10 mg into the skin once a week.     OVER THE COUNTER MEDICATION Take 1 Dose by mouth daily. Potassium 650  mg per patient     spironolactone (ALDACTONE) 25 MG tablet Take 1 tablet (25 mg total) by mouth daily. 90 tablet 2   valsartan-hydrochlorothiazide (DIOVAN-HCT) 320-25 MG tablet Take 1 tablet by mouth daily.     No current facility-administered medications for this visit.    No Known Allergies

## 2023-10-29 NOTE — Telephone Encounter (Signed)
ASA 3 due to BMI - okay for room 1/2.   Hold Mounjaro for 1 week prior. Hold metformin night prior to morning of procedure BMP pre-op

## 2023-10-30 NOTE — Telephone Encounter (Signed)
LMOVM to call back 

## 2023-11-04 MED ORDER — PEG 3350-KCL-NA BICARB-NACL 420 G PO SOLR: 4000.0000 mL | Freq: Once | ORAL | 0 refills | Status: AC

## 2023-11-04 NOTE — Addendum Note (Signed)
Addended by: Armstead Peaks on: 11/04/2023 02:43 PM   Modules accepted: Orders

## 2023-11-04 NOTE — Telephone Encounter (Signed)
Questionnaire from recall, no referral needed  

## 2023-11-04 NOTE — Telephone Encounter (Signed)
Patient called back. Scheduled for 1/13. Aware will mail instructions to her. Rx for prep sent to pharmacy. She takes mounjaro on Sundays and aware to hold day prior dose as she can't take x 7 days before procedure. She voiced understanding.

## 2023-11-21 ENCOUNTER — Other Ambulatory Visit: Payer: Self-pay | Admitting: Cardiovascular Disease

## 2023-11-25 ENCOUNTER — Other Ambulatory Visit (HOSPITAL_COMMUNITY)
Admission: RE | Admit: 2023-11-25 | Discharge: 2023-11-25 | Disposition: A | Discharge status: Home / Self Care | Payer: Managed Care, Other (non HMO) | Source: Ambulatory Visit | Attending: Internal Medicine | Admitting: Internal Medicine

## 2023-11-25 DIAGNOSIS — Z860100 Personal history of colon polyps, unspecified: Secondary | ICD-10-CM | POA: Diagnosis not present

## 2023-11-25 DIAGNOSIS — Z01812 Encounter for preprocedural laboratory examination: Secondary | ICD-10-CM | POA: Insufficient documentation

## 2023-11-25 LAB — BASIC METABOLIC PANEL
Anion gap: 6 (ref 5–15)
BUN: 22 mg/dL — ABNORMAL HIGH (ref 6–20)
CO2: 24 mmol/L (ref 22–32)
Calcium: 9.1 mg/dL (ref 8.9–10.3)
Chloride: 102 mmol/L (ref 98–111)
Creatinine, Ser: 1.34 mg/dL — ABNORMAL HIGH (ref 0.44–1.00)
GFR, Estimated: 47 mL/min — ABNORMAL LOW (ref >60)
Glucose, Bld: 131 mg/dL — ABNORMAL HIGH (ref 70–99)
Potassium: 3.5 mmol/L (ref 3.5–5.1)
Sodium: 132 mmol/L — ABNORMAL LOW (ref 135–145)

## 2023-12-02 ENCOUNTER — Ambulatory Visit (HOSPITAL_COMMUNITY): Payer: Managed Care, Other (non HMO) | Admitting: Anesthesiology

## 2023-12-02 ENCOUNTER — Encounter (HOSPITAL_COMMUNITY)
Admission: RE | Disposition: A | Discharge status: Home / Self Care | Payer: Self-pay | Source: Home / Self Care | Attending: Internal Medicine

## 2023-12-02 ENCOUNTER — Encounter (HOSPITAL_COMMUNITY): Payer: Self-pay | Admitting: Internal Medicine

## 2023-12-02 ENCOUNTER — Ambulatory Visit (HOSPITAL_COMMUNITY)
Admission: RE | Admit: 2023-12-02 | Discharge: 2023-12-02 | Disposition: A | Discharge status: Home / Self Care | Payer: Managed Care, Other (non HMO) | Attending: Internal Medicine | Admitting: Internal Medicine

## 2023-12-02 ENCOUNTER — Other Ambulatory Visit: Payer: Self-pay

## 2023-12-02 DIAGNOSIS — I10 Essential (primary) hypertension: Secondary | ICD-10-CM | POA: Insufficient documentation

## 2023-12-02 DIAGNOSIS — Z7985 Long-term (current) use of injectable non-insulin antidiabetic drugs: Secondary | ICD-10-CM | POA: Insufficient documentation

## 2023-12-02 DIAGNOSIS — Z860101 Personal history of adenomatous and serrated colon polyps: Secondary | ICD-10-CM | POA: Insufficient documentation

## 2023-12-02 DIAGNOSIS — Z860100 Personal history of colon polyps, unspecified: Secondary | ICD-10-CM

## 2023-12-02 DIAGNOSIS — Z6841 Body Mass Index (BMI) 40.0 and over, adult: Secondary | ICD-10-CM | POA: Diagnosis not present

## 2023-12-02 DIAGNOSIS — E66813 Obesity, class 3: Secondary | ICD-10-CM | POA: Insufficient documentation

## 2023-12-02 DIAGNOSIS — Z1211 Encounter for screening for malignant neoplasm of colon: Secondary | ICD-10-CM | POA: Insufficient documentation

## 2023-12-02 DIAGNOSIS — Z7984 Long term (current) use of oral hypoglycemic drugs: Secondary | ICD-10-CM | POA: Insufficient documentation

## 2023-12-02 DIAGNOSIS — E119 Type 2 diabetes mellitus without complications: Secondary | ICD-10-CM | POA: Insufficient documentation

## 2023-12-02 HISTORY — PX: COLONOSCOPY WITH PROPOFOL: SHX5780

## 2023-12-02 LAB — GLUCOSE, CAPILLARY: Glucose-Capillary: 108 mg/dL — ABNORMAL HIGH (ref 70–99)

## 2023-12-02 SURGERY — COLONOSCOPY WITH PROPOFOL
Anesthesia: General

## 2023-12-02 MED ORDER — LACTATED RINGERS IV SOLN: INTRAVENOUS | Status: DC | PRN

## 2023-12-02 MED ORDER — PROPOFOL 500 MG/50ML IV EMUL
INTRAVENOUS | Status: DC | PRN
  Administered 2023-12-02: 150 ug/kg/min via INTRAVENOUS

## 2023-12-02 MED ORDER — LIDOCAINE HCL (CARDIAC) PF 100 MG/5ML IV SOSY
PREFILLED_SYRINGE | INTRAVENOUS | Status: DC | PRN
  Administered 2023-12-02: 50 mg via INTRATRACHEAL

## 2023-12-02 MED ORDER — PROPOFOL 10 MG/ML IV BOLUS
INTRAVENOUS | Status: DC | PRN
  Administered 2023-12-02: 50 mg via INTRAVENOUS
  Administered 2023-12-02: 100 mg via INTRAVENOUS

## 2023-12-02 NOTE — Anesthesia Postprocedure Evaluation (Signed)
 Anesthesia Post Note  Patient: Jillian Adkins  Procedure(s) Performed: COLONOSCOPY WITH PROPOFOL   Patient location during evaluation: Endoscopy Anesthesia Type: General Level of consciousness: awake and alert Pain management: pain level controlled Vital Signs Assessment: post-procedure vital signs reviewed and stable Respiratory status: spontaneous breathing Cardiovascular status: blood pressure returned to baseline and stable Postop Assessment: no apparent nausea or vomiting Anesthetic complications: no   No notable events documented.   Last Vitals:  Vitals:   12/02/23 0929 12/02/23 0940  BP: (!) 168/100 (!) 165/101  Pulse: 80   Resp: (!) 22   Temp: 36.4 C   SpO2: 98%     Last Pain:  Vitals:   12/02/23 1121  TempSrc:   PainSc: 0-No pain                 Xiana Carns

## 2023-12-02 NOTE — Discharge Instructions (Addendum)
  Colonoscopy Discharge Instructions  Read the instructions outlined below and refer to this sheet in the next few weeks. These discharge instructions provide you with general information on caring for yourself after you leave the hospital. Your doctor may also give you specific instructions. While your treatment has been planned according to the most current medical practices available, unavoidable complications occasionally occur. If you have any problems or questions after discharge, call Dr. Shaaron at [phone]. ACTIVITY You may resume your regular activity, but move at a slower pace for the next 24 hours.  Take frequent rest periods for the next 24 hours.  Walking will help get rid of the air and reduce the bloated feeling in your belly (abdomen).  No driving for 24 hours (because of the medicine (anesthesia) used during the test).   Do not sign any important legal documents or operate any machinery for 24 hours (because of the anesthesia used during the test).  NUTRITION Drink plenty of fluids.  You may resume your normal diet as instructed by your doctor.  Begin with a light meal and progress to your normal diet. Heavy or fried foods are harder to digest and may make you feel sick to your stomach (nauseated).  Avoid alcoholic beverages for 24 hours or as instructed.  MEDICATIONS You may resume your normal medications unless your doctor tells you otherwise.  WHAT YOU CAN EXPECT TODAY Some feelings of bloating in the abdomen.  Passage of more gas than usual.  Spotting of blood in your stool or on the toilet paper.  IF YOU HAD POLYPS REMOVED DURING THE COLONOSCOPY: No aspirin products for 7 days or as instructed.  No alcohol for 7 days or as instructed.  Eat a soft diet for the next 24 hours.  FINDING OUT THE RESULTS OF YOUR TEST Not all test results are available during your visit. If your test results are not back during the visit, make an appointment with your caregiver to find out the  results. Do not assume everything is normal if you have not heard from your caregiver or the medical facility. It is important for you to follow up on all of your test results.  SEEK IMMEDIATE MEDICAL ATTENTION IF: You have more than a spotting of blood in your stool.  Your belly is swollen (abdominal distention).  You are nauseated or vomiting.  You have a temperature over 101.  You have abdominal pain or discomfort that is severe or gets worse throughout the day.        Your colon appeared normal today  It is recommended you return for repeat colonoscopy in 7 years   at patient request, I called Madissen Wyse at [phone] -  reviewed findings and recommendations

## 2023-12-02 NOTE — Anesthesia Preprocedure Evaluation (Signed)
 Anesthesia Evaluation  Patient identified by MRN, date of birth, ID band Patient awake    Reviewed: Allergy & Precautions, H&P , NPO status , Patient's Chart, lab work & pertinent test results, reviewed documented beta blocker date and time   Airway Mallampati: II  TM Distance: >3 FB Neck ROM: full    Dental no notable dental hx. (+) Dental Advisory Given, Teeth Intact   Pulmonary neg pulmonary ROS   Pulmonary exam normal breath sounds clear to auscultation       Cardiovascular Exercise Tolerance: Good hypertension, Normal cardiovascular exam Rhythm:regular Rate:Normal     Neuro/Psych negative neurological ROS  negative psych ROS   GI/Hepatic negative GI ROS, Neg liver ROS,,,  Endo/Other  diabetes, Type 2  Class 3 obesity  Renal/GU negative Renal ROS  negative genitourinary   Musculoskeletal   Abdominal   Peds  Hematology negative hematology ROS (+)   Anesthesia Other Findings   Reproductive/Obstetrics negative OB ROS                             Anesthesia Physical Anesthesia Plan  ASA: 3  Anesthesia Plan: General   Post-op Pain Management: Minimal or no pain anticipated   Induction: Intravenous  PONV Risk Score and Plan: Propofol  infusion  Airway Management Planned: Nasal Cannula and Natural Airway  Additional Equipment: None  Intra-op Plan:   Post-operative Plan:   Informed Consent: I have reviewed the patients History and Physical, chart, labs and discussed the procedure including the risks, benefits and alternatives for the proposed anesthesia with the patient or authorized representative who has indicated his/her understanding and acceptance.     Dental Advisory Given  Plan Discussed with: CRNA  Anesthesia Plan Comments:         Anesthesia Quick Evaluation

## 2023-12-02 NOTE — Transfer of Care (Signed)
 Immediate Anesthesia Transfer of Care Note  Patient: Jillian Adkins  Procedure(s) Performed: COLONOSCOPY WITH PROPOFOL   Patient Location: Short Stay  Anesthesia Type:General  Level of Consciousness: awake  Airway & Oxygen Therapy: Patient Spontanous Breathing  Post-op Assessment: Report given to RN  Post vital signs: Reviewed and stable  Last Vitals:  Vitals Value Taken Time  BP    Temp    Pulse    Resp    SpO2      Last Pain:  Vitals:   12/02/23 1121  TempSrc:   PainSc: 0-No pain      Patients Stated Pain Goal: 9 (12/02/23 0929)  Complications: No notable events documented.

## 2023-12-02 NOTE — Op Note (Signed)
 [facility] Patient Name: Jillian Adkins Procedure Date: 12/02/2023 10:57 AM MRN: [id number] Date of Birth: 25-Mar-1966 Attending MD: Lamar Ozell Hollingshead , MD, [id number] CSN: [id number] Age: 58 Admit Type: Outpatient Procedure:                Colonoscopy Indications:              High risk colon cancer surveillance: Personal                            history of colonic polyps Providers:                Lamar Ozell Hollingshead, MD, Devere Lodge, Nidia Oak Referring MD:              Medicines:                Propofol  per Anesthesia Complications:            No immediate complications. Estimated Blood Loss:     Estimated blood loss: none. Procedure:                Pre-Anesthesia Assessment:                           - Prior to the procedure, a History and Physical                            was performed, and patient medications and                            allergies were reviewed. The patient's tolerance of                            previous anesthesia was also reviewed. The risks                            and benefits of the procedure and the sedation                            options and risks were discussed with the patient.                            All questions were answered, and informed consent                            was obtained. Prior Anticoagulants: The patient has                            taken no anticoagulant or antiplatelet agents. ASA                            Grade Assessment: II - A patient with mild systemic  disease. After reviewing the risks and benefits,                            the patient was deemed in satisfactory condition to                            undergo the procedure.                           After obtaining informed consent, the colonoscope                            was passed under direct vision. Throughout the                            procedure, the patient's blood  pressure, pulse, and                            oxygen saturations were monitored continuously. The                            [id number]) scope was introduced through the                            anus and advanced to the the cecum, identified by                            appendiceal orifice and ileocecal valve. The                            colonoscopy was performed without difficulty. The                            patient tolerated the procedure well. The quality                            of the bowel preparation was adequate. The                            ileocecal valve, appendiceal orifice, and rectum                            were photographed. Scope In: 11:26:16 AM Scope Out: 11:36:33 AM Scope Withdrawal Time: 0 hours 6 minutes 34 seconds  Total Procedure Duration: 0 hours 10 minutes 17 seconds  Findings:      The perianal and digital rectal examinations were normal.      The colon (entire examined portion) appeared normal.      The retroflexed view of the distal rectum and anal verge was normal and       showed no anal or rectal abnormalities. Impression:               - The entire examined colon is normal.                           -  The distal rectum and anal verge are normal on                            retroflexion view.                           - No specimens collected. Moderate Sedation:      Moderate (conscious) sedation was personally administered by an       anesthesia professional. The following parameters were monitored: oxygen       saturation, heart rate, blood pressure, and response to care. Recommendation:           - Patient has a contact number available for                            emergencies. The signs and symptoms of potential                            delayed complications were discussed with the                            patient. Return to normal activities tomorrow.                            Written discharge instructions were  provided to the                            patient.                           - Advance diet as tolerated.                           - Continue present medications.                           - Repeat colonoscopy in 7 years for surveillance.                           - Return to GI office (date not yet determined). Procedure Code(s):        --- Professional ---                           [phone], Colonoscopy, flexible; diagnostic, including                            collection of specimen(s) by brushing or washing,                            when performed (separate procedure) Diagnosis Code(s):        --- Professional ---                           Z86.010, Personal history of colonic polyps CPT copyright 2022 [employer]. All rights reserved. The codes documented in this report are preliminary and upon coder review may  be  revised to meet current compliance requirements. Lamar HERO. Tyrina Hines, MD Lamar Ozell Hollingshead, MD 12/02/2023 11:44:21 AM This report has been signed electronically. Number of Addenda: 0

## 2023-12-02 NOTE — H&P (Signed)
 @LOGO @   Primary Care Physician:  Leonce Lucie PARAS, PA-C Primary Gastroenterologist:  Dr. Shaaron  Pre-Procedure History & Physical: HPI:  Jillian Adkins is a 58 y.o. female here for  surveillance colonoscopy.  History of a sessile serrated polyp removed per descending colon in 2019.    No family history of colon cancer.  Past Medical History:  Diagnosis Date   Diabetes mellitus without complication (HCC)    Hypertension     Past Surgical History:  Procedure Laterality Date   CESAREAN SECTION  [phone]   COLONOSCOPY N/A 08/08/2018   Procedure: COLONOSCOPY;  Surgeon: Shaaron Lamar HERO, MD;  Location: [facility];  Service: Endoscopy;  Laterality: N/A;  11:30   POLYPECTOMY  08/08/2018   Procedure: POLYPECTOMY;  Surgeon: Shaaron Lamar HERO, MD;  Location: [facility];  Service: Endoscopy;;   TUBAL LIGATION      Prior to Admission medications   Medication Sig Start Date End Date Taking? Authorizing Provider  atorvastatin  (LIPITOR) 10 MG tablet TAKE 1 TABLET BY MOUTH EVERY DAY 12/25/22  Yes O'Neal, Darryle Ned, MD  carvedilol  (COREG ) 25 MG tablet TAKE 1 TABLET BY MOUTH TWICE A DAY 12/26/22  Yes O'Neal, Darryle Ned, MD  metFORMIN (GLUCOPHAGE) 1000 MG tablet Take 500 mg by mouth 2 (two) times daily with a meal.   Yes [provider]  OVER THE COUNTER MEDICATION Take 1 Dose by mouth daily. Potassium 650 mg per patient   Yes [provider]  spironolactone  (ALDACTONE ) 25 MG tablet TAKE 1 TABLET (25 MG TOTAL) BY MOUTH DAILY. 11/21/23  Yes Mallipeddi, Vishnu P, MD  valsartan-hydrochlorothiazide  (DIOVAN-HCT) 320-25 MG tablet Take 1 tablet by mouth daily. 04/24/21  Yes [provider]  MOUNJARO 10 MG/0.5ML Pen Inject 10 mg into the skin once a week. 08/13/23   [provider]    Allergies as of 11/04/2023   (No Known Allergies)    Family History  Problem Relation Age of Onset   Hypertension Mother    Diabetes Mother    Colon cancer Neg Hx      Social History   Socioeconomic History   Marital status: Married    Spouse name: Not on file   Number of children: 3   Years of education: Not on file   Highest education level: Not on file  Occupational History   Occupation: Nurse  Tobacco Use   Smoking status: Never   Smokeless tobacco: Never  Vaping Use   Vaping status: Never Used  Substance and Sexual Activity   Alcohol use: No   Drug use: No   Sexual activity: Yes    Birth control/protection: Surgical    Comment: tubal  Other Topics Concern   Not on file  Social History Narrative   Not on file   Social Drivers of Health   Financial Resource Strain: Not on file  Food Insecurity: Not on file  Transportation Needs: Not on file  Physical Activity: Not on file  Stress: Not on file  Social Connections: Not on file  Intimate Partner Violence: Not on file    Review of Systems: See HPI, otherwise negative ROS  Physical Exam: BP (!) 165/101   Pulse 80   Temp 97.6 F (36.4 C) (Oral)   Resp (!) 22   Ht 5' 4 (1.626 m)   Wt 107.5 kg   LMP 10/23/2015   SpO2 98%   BMI 40.68 kg/m  General:   Alert,  Well-developed, well-nourished, pleasant and cooperative in NAD  Neck:  Supple; no masses or thyromegaly. No significant cervical adenopathy. Lungs:  Clear throughout to auscultation.   No wheezes, crackles, or rhonchi. No acute distress. Heart:  Regular rate and rhythm; no murmurs, clicks, rubs,  or gallops. Abdomen: Non-distended, normal bowel sounds.  Soft and nontender without appreciable mass or hepatosplenomegaly.    Impression/Plan:    58 year old lady with history of serrated polyp removed previously here for surveillance colonoscopy.  The risks, benefits, limitations, alternatives and imponderables have been reviewed with the patient. Questions have been answered. All parties are agreeable.       Notice: This dictation was prepared with Dragon dictation along with smaller phrase technology. Any  transcriptional errors that result from this process are unintentional and may not be corrected upon review.

## 2023-12-03 ENCOUNTER — Encounter (HOSPITAL_COMMUNITY): Payer: Self-pay | Admitting: Internal Medicine

## 2023-12-19 ENCOUNTER — Other Ambulatory Visit: Payer: Self-pay | Admitting: Cardiovascular Disease

## 2024-03-05 ENCOUNTER — Other Ambulatory Visit (HOSPITAL_COMMUNITY): Payer: Self-pay | Admitting: Family Medicine

## 2024-03-05 DIAGNOSIS — Z1231 Encounter for screening mammogram for malignant neoplasm of breast: Secondary | ICD-10-CM

## 2024-03-23 ENCOUNTER — Ambulatory Visit (HOSPITAL_COMMUNITY)
Admission: RE | Admit: 2024-03-23 | Discharge: 2024-03-23 | Disposition: A | Discharge status: Home / Self Care | Source: Ambulatory Visit | Attending: Family Medicine | Admitting: Family Medicine

## 2024-03-23 DIAGNOSIS — Z1231 Encounter for screening mammogram for malignant neoplasm of breast: Secondary | ICD-10-CM | POA: Insufficient documentation

## 2024-06-16 ENCOUNTER — Ambulatory Visit: Admitting: Physician Assistant

## 2024-07-10 ENCOUNTER — Encounter: Payer: Self-pay | Admitting: Radiology

## 2024-09-21 ENCOUNTER — Encounter: Payer: Self-pay | Admitting: Radiology

## 2024-11-17 ENCOUNTER — Other Ambulatory Visit: Payer: Self-pay | Admitting: Internal Medicine

## 2024-12-18 ENCOUNTER — Ambulatory Visit: Payer: Self-pay | Admitting: Internal Medicine

## 2025-03-01 ENCOUNTER — Ambulatory Visit: Payer: Self-pay | Admitting: Internal Medicine
